# Patient Record
Sex: Female | Born: 1955
Health system: Southern US, Community
[De-identification: ages and names within clinical notes are randomized; demographics above are authoritative.]

## PROBLEM LIST (undated history)

## (undated) DIAGNOSIS — E785 Hyperlipidemia, unspecified: Secondary | ICD-10-CM

## (undated) DIAGNOSIS — M8580 Other specified disorders of bone density and structure, unspecified site: Secondary | ICD-10-CM

## (undated) DIAGNOSIS — T7840XA Allergy, unspecified, initial encounter: Secondary | ICD-10-CM

## (undated) DIAGNOSIS — F329 Major depressive disorder, single episode, unspecified: Secondary | ICD-10-CM

## (undated) DIAGNOSIS — I10 Essential (primary) hypertension: Secondary | ICD-10-CM

## (undated) DIAGNOSIS — H905 Unspecified sensorineural hearing loss: Secondary | ICD-10-CM

## (undated) DIAGNOSIS — F32A Depression, unspecified: Secondary | ICD-10-CM

## (undated) DIAGNOSIS — G43909 Migraine, unspecified, not intractable, without status migrainosus: Secondary | ICD-10-CM

## (undated) DIAGNOSIS — K219 Gastro-esophageal reflux disease without esophagitis: Secondary | ICD-10-CM

## (undated) DIAGNOSIS — M1990 Unspecified osteoarthritis, unspecified site: Secondary | ICD-10-CM

## (undated) HISTORY — DX: Unspecified osteoarthritis, unspecified site: M19.90

## (undated) HISTORY — DX: Essential (primary) hypertension: I10

## (undated) HISTORY — DX: Other specified disorders of bone density and structure, unspecified site: M85.80

## (undated) HISTORY — DX: Allergy, unspecified, initial encounter: T78.40XA

## (undated) HISTORY — DX: Unspecified sensorineural hearing loss: H90.5

## (undated) HISTORY — DX: Gastro-esophageal reflux disease without esophagitis: K21.9

## (undated) HISTORY — DX: Depression, unspecified: F32.A

## (undated) HISTORY — DX: Migraine, unspecified, not intractable, without status migrainosus: G43.909

## (undated) HISTORY — DX: Major depressive disorder, single episode, unspecified: F32.9

## (undated) HISTORY — DX: Hyperlipidemia, unspecified: E78.5

---

## 2001-01-08 ENCOUNTER — Emergency Department (HOSPITAL_COMMUNITY): Admission: EM | Admit: 2001-01-08 | Discharge: 2001-01-08 | Payer: Self-pay | Admitting: Emergency Medicine

## 2005-05-09 ENCOUNTER — Ambulatory Visit (HOSPITAL_COMMUNITY): Admission: RE | Admit: 2005-05-09 | Discharge: 2005-05-09 | Payer: Self-pay | Admitting: *Deleted

## 2006-02-06 HISTORY — PX: JOINT REPLACEMENT: SHX530

## 2010-04-27 ENCOUNTER — Other Ambulatory Visit: Payer: Self-pay | Admitting: Physical Medicine and Rehabilitation

## 2010-04-27 DIAGNOSIS — R52 Pain, unspecified: Secondary | ICD-10-CM

## 2010-05-06 ENCOUNTER — Ambulatory Visit
Admission: RE | Admit: 2010-05-06 | Discharge: 2010-05-06 | Disposition: A | Discharge status: Home / Self Care | Payer: BC Managed Care – HMO | Source: Ambulatory Visit | Attending: Physical Medicine and Rehabilitation | Admitting: Physical Medicine and Rehabilitation

## 2010-05-06 DIAGNOSIS — R52 Pain, unspecified: Secondary | ICD-10-CM

## 2010-05-16 ENCOUNTER — Other Ambulatory Visit: Payer: Self-pay | Admitting: Physical Medicine and Rehabilitation

## 2010-05-16 DIAGNOSIS — M542 Cervicalgia: Secondary | ICD-10-CM

## 2010-05-18 ENCOUNTER — Ambulatory Visit
Admission: RE | Admit: 2010-05-18 | Discharge: 2010-05-18 | Disposition: A | Discharge status: Home / Self Care | Payer: BC Managed Care – HMO | Source: Ambulatory Visit | Attending: Physical Medicine and Rehabilitation | Admitting: Physical Medicine and Rehabilitation

## 2010-05-18 DIAGNOSIS — M542 Cervicalgia: Secondary | ICD-10-CM

## 2011-03-30 ENCOUNTER — Ambulatory Visit (INDEPENDENT_AMBULATORY_CARE_PROVIDER_SITE_OTHER): Payer: BC Managed Care – PPO | Admitting: Emergency Medicine

## 2011-03-30 DIAGNOSIS — R209 Unspecified disturbances of skin sensation: Secondary | ICD-10-CM

## 2011-03-30 DIAGNOSIS — M47812 Spondylosis without myelopathy or radiculopathy, cervical region: Secondary | ICD-10-CM

## 2011-03-30 DIAGNOSIS — M47819 Spondylosis without myelopathy or radiculopathy, site unspecified: Secondary | ICD-10-CM

## 2011-03-30 DIAGNOSIS — M545 Low back pain: Secondary | ICD-10-CM

## 2011-03-30 MED ORDER — CYCLOBENZAPRINE HCL 10 MG PO TABS: ORAL_TABLET | ORAL | Status: DC

## 2011-03-30 MED ORDER — PREDNISONE 20 MG PO TABS: ORAL_TABLET | ORAL | Status: DC

## 2011-03-30 NOTE — Progress Notes (Signed)
  Subjective:    Patient ID: Kristen Scott, female    DOB: [birth year], 56 y.o.   MRN: [id number]  HPI patient enters with a 2 week history of pain in her lower back. She states she has a history of low back problems. She's been to Dr. Ethelene Hal in the past and had injections of her neck before. She denies any recent trauma to her back but does work at a daycare facility. She generally takes 1-2 hydrocodone per day for her back pain.    Review of Systems patient denies urinary symptoms she denies dysuria or bone pain.     Objective:   Physical Exam physical exam reveals tenderness over the lower lumbar spine. There is a scar over the left knee from previous joint replacement to straight leg raising is negative bilaterally. Deep tendon reflexes are symmetrical. There was no weakness of the lower extremities by exam.        Assessment & Plan:  Patient in with exacerbation of her low back pain. She has known cervical and low back issues. She is under the care of Dr. Ethelene Hal for these problems.

## 2011-03-30 NOTE — Patient Instructions (Signed)
Back Pain, Adult Low back pain is very common. About 1 in 5 people have back pain.The cause of low back pain is rarely dangerous. The pain often gets better over time.About half of people with a sudden onset of back pain feel better in just 2 weeks. About 8 in 10 people feel better by 6 weeks.  CAUSES Some common causes of back pain include:  Strain of the muscles or ligaments supporting the spine.   Wear and tear (degeneration) of the spinal discs.   Arthritis.   Direct injury to the back.  DIAGNOSIS Most of the time, the direct cause of low back pain is not known.However, back pain can be treated effectively even when the exact cause of the pain is unknown.Answering your caregiver's questions about your overall health and symptoms is one of the most accurate ways to make sure the cause of your pain is not dangerous. If your caregiver needs more information, he or she may order lab work or imaging tests (X-rays or MRIs).However, even if imaging tests show changes in your back, this usually does not require surgery. HOME CARE INSTRUCTIONS For many people, back pain returns.Since low back pain is rarely dangerous, it is often a condition that people can learn to manageon their own.   Remain active. It is stressful on the back to sit or stand in one place. Do not sit, drive, or stand in one place for more than 30 minutes at a time. Take short walks on level surfaces as soon as pain allows.Try to increase the length of time you walk each day.   Do not stay in bed.Resting more than 1 or 2 days can delay your recovery.   Do not avoid exercise or work.Your body is made to move.It is not dangerous to be active, even though your back may hurt.Your back will likely heal faster if you return to being active before your pain is gone.   Pay attention to your body when you bend and lift. Many people have less discomfortwhen lifting if they bend their knees, keep the load close to their  bodies,and avoid twisting. Often, the most comfortable positions are those that put less stress on your recovering back.   Find a comfortable position to sleep. Use a firm mattress and lie on your side with your knees slightly bent. If you lie on your back, put a pillow under your knees.   Only take over-the-counter or prescription medicines as directed by your caregiver. Over-the-counter medicines to reduce pain and inflammation are often the most helpful.Your caregiver may prescribe muscle relaxant drugs.These medicines help dull your pain so you can more quickly return to your normal activities and healthy exercise.   Put ice on the injured area.   Put ice in a plastic bag.   Place a towel between your skin and the bag.   Leave the ice on for 15 to 20 minutes, 3 to 4 times a day for the first 2 to 3 days. After that, ice and heat may be alternated to reduce pain and spasms.   Ask your caregiver about trying back exercises and gentle massage. This may be of some benefit.   Avoid feeling anxious or stressed.Stress increases muscle tension and can worsen back pain.It is important to recognize when you are anxious or stressed and learn ways to manage it.Exercise is a great option.  SEEK MEDICAL CARE IF:  You have pain that is not relieved with rest or medicine.   You have   pain that does not improve in 1 week.   You have new symptoms.   You are generally not feeling well.  SEEK IMMEDIATE MEDICAL CARE IF:   You have pain that radiates from your back into your legs.   You develop new bowel or bladder control problems.   You have unusual weakness or numbness in your arms or legs.   You develop nausea or vomiting.   You develop abdominal pain.   You feel faint.  Document Released: 01/23/2005 Document Revised: 10/05/2010 Document Reviewed: 06/13/2010 [employer] Patient Information 2012 [employer]. 

## 2011-08-17 ENCOUNTER — Encounter: Payer: Self-pay | Admitting: Family Medicine

## 2011-08-17 ENCOUNTER — Ambulatory Visit (INDEPENDENT_AMBULATORY_CARE_PROVIDER_SITE_OTHER): Payer: BC Managed Care – PPO | Admitting: Family Medicine

## 2011-08-17 VITALS — BP 134/91 | HR 74 | Temp 97.4°F | Resp 16 | Ht 62.0 in | Wt 214.0 lb

## 2011-08-17 DIAGNOSIS — E559 Vitamin D deficiency, unspecified: Secondary | ICD-10-CM

## 2011-08-17 DIAGNOSIS — Z0000 Encounter for general adult medical examination without abnormal findings: Secondary | ICD-10-CM

## 2011-08-17 DIAGNOSIS — E669 Obesity, unspecified: Secondary | ICD-10-CM

## 2011-08-17 DIAGNOSIS — E785 Hyperlipidemia, unspecified: Secondary | ICD-10-CM

## 2011-08-17 DIAGNOSIS — I10 Essential (primary) hypertension: Secondary | ICD-10-CM

## 2011-08-17 DIAGNOSIS — F329 Major depressive disorder, single episode, unspecified: Secondary | ICD-10-CM

## 2011-08-17 LAB — LIPID PANEL
HDL: 38 mg/dL — ABNORMAL LOW (ref >39)
LDL Cholesterol: 142 mg/dL — ABNORMAL HIGH (ref 0–99)
Total CHOL/HDL Ratio: 6.5 Ratio
VLDL: 68 mg/dL — ABNORMAL HIGH (ref 0–40)

## 2011-08-17 LAB — POCT URINALYSIS DIPSTICK
Blood, UA: NEGATIVE
Ketones, UA: NEGATIVE
Leukocytes, UA: NEGATIVE
Nitrite, UA: NEGATIVE
Spec Grav, UA: 1.02
Urobilinogen, UA: 0.2

## 2011-08-17 LAB — COMPREHENSIVE METABOLIC PANEL WITH GFR
ALT: 23 U/L (ref 0–35)
AST: 23 U/L (ref 0–37)
Albumin: 3.9 g/dL (ref 3.5–5.2)
Alkaline Phosphatase: 93 U/L (ref 39–117)
BUN: 20 mg/dL (ref 6–23)
CO2: 24 meq/L (ref 19–32)
Calcium: 9 mg/dL (ref 8.4–10.5)
Chloride: 106 meq/L (ref 96–112)
Creat: 0.69 mg/dL (ref 0.50–1.10)
Glucose, Bld: 100 mg/dL — ABNORMAL HIGH (ref 70–99)
Potassium: 4.1 meq/L (ref 3.5–5.3)
Sodium: 140 meq/L (ref 135–145)
Total Bilirubin: 0.4 mg/dL (ref 0.3–1.2)
Total Protein: 6.9 g/dL (ref 6.0–8.3)

## 2011-08-17 MED ORDER — ATORVASTATIN CALCIUM 40 MG PO TABS: 40.0000 mg | ORAL_TABLET | Freq: Every day | ORAL | Status: DC

## 2011-08-17 MED ORDER — METOPROLOL SUCCINATE ER 100 MG PO TB24: 100.0000 mg | ORAL_TABLET | Freq: Every day | ORAL | Status: DC

## 2011-08-17 MED ORDER — DULOXETINE HCL 60 MG PO CPEP: 60.0000 mg | ORAL_CAPSULE | Freq: Every day | ORAL | Status: DC

## 2011-08-17 MED ORDER — DULOXETINE HCL 30 MG PO CPEP: 30.0000 mg | ORAL_CAPSULE | Freq: Every day | ORAL | Status: DC

## 2011-08-17 NOTE — Patient Instructions (Addendum)
Calorie Counting Diet A calorie counting diet requires you to eat the number of calories that are right for you in a day. Calories are the measurement of how much energy you get from the food you eat. Eating the right amount of calories is important for staying at a healthy weight. If you eat too many calories, your body will store them as fat and you may gain weight. If you eat too few calories, you may lose weight. Counting the number of calories you eat during a day will help you know if you are eating the right amount. A Registered Dietitian can determine how many calories you need in a day. The amount of calories needed varies from person to person. If your goal is to lose weight, you will need to eat fewer calories. Losing weight can benefit you if you are overweight or have health problems such as heart disease, high blood pressure, or diabetes. If your goal is to gain weight, you will need to eat more calories. Gaining weight may be necessary if you have a certain health problem that causes your body to need more energy. TIPS Whether you are increasing or decreasing the number of calories you eat during a day, it may be hard to get used to changes in what you eat and drink. The following are tips to help you keep track of the number of calories you eat.  Measure foods at home with measuring cups. This helps you know the amount of food and number of calories you are eating.   Restaurants often serve food in amounts that are larger than 1 serving. While eating out, estimate how many servings of a food you are given. For example, a serving of cooked rice is  cup or about the size of half of a fist. Knowing serving sizes will help you be aware of how much food you are eating at restaurants.   Ask for smaller portion sizes or child-size portions at restaurants.   Plan to eat half of a meal at a restaurant. Take the rest home or share the other half with a friend.   Read the Nutrition Facts panel on  food labels for calorie content and serving size. You can find out how many servings are in a package, the size of a serving, and the number of calories each serving has.   For example, a package might contain 3 cookies. The Nutrition Facts panel on that package says that 1 serving is 1 cookie. Below that, it will say there are 3 servings in the container. The calories section of the Nutrition Facts label says there are 90 calories. This means there are 90 calories in 1 cookie (1 serving). If you eat 1 cookie you have eaten 90 calories. If you eat all 3 cookies, you have eaten 270 calories (3 servings x 90 calories = 270 calories).  The list below tells you how big or small some common portion sizes are.  1 oz.........4 stacked dice.   3 oz.........Deck of cards.   1 tsp........Tip of little finger.   1 tbs........Thumb.   2 tbs........Golf ball.    cup.......Half of a fist.   1 cup........A fist.  KEEP A FOOD LOG Write down every food item you eat, the amount you eat, and the number of calories in each food you eat during the day. At the end of the day, you can add up the total number of calories you have eaten. It may help to keep a   list like the one below. Find out the calorie information by reading the Nutrition Facts panel on food labels. Breakfast  Bran cereal (1 cup, 110 calories).   Fat-free milk ( cup, 45 calories).  Snack  Apple (1 medium, 80 calories).  Lunch  Spinach (1 cup, 20 calories).   Tomato ( medium, 20 calories).   Chicken breast strips (3 oz, 165 calories).   Shredded cheddar cheese ( cup, 110 calories).   Light [place] dressing (2 tbs, 60 calories).   Whole-wheat bread (1 slice, 80 calories).   Tub margarine (1 tsp, 35 calories).   Vegetable soup (1 cup, 160 calories).  Dinner  Pork chop (3 oz, 190 calories).   Brown rice (1 cup, 215 calories).   Steamed broccoli ( cup, 20 calories).   Strawberries (1  cup, 65 calories).   Whipped  cream (1 tbs, 50 calories).  Daily Calorie Total: 1425 Document Released: 01/23/2005 Document Revised: 01/12/2011 Document Reviewed: 07/20/2006 [facility] Patient Information 2012 [place], [place]   .Keeping You Healthy  Get These Tests  Blood Pressure- Have your blood pressure checked by your healthcare provider at least once a year.  Normal blood pressure is 120/80.  Weight- Have your body mass index (BMI) calculated to screen for obesity.  BMI is a measure of body fat based on height and weight.  You can calculate your own BMI at https://www.west-esparza.com/  Cholesterol- Have your cholesterol checked every year.  Diabetes- Have your blood sugar checked every year if you have high blood pressure, high cholesterol, a family history of diabetes or if you are overweight.  Pap Smear- Have a pap smear every 1 to 3 years if you have been sexually active.  If you are older than 65 and recent pap smears have been normal you may not need additional pap smears.  In addition, if you have had a hysterectomy  For benign disease additional pap smears are not necessary.  Mammogram-Yearly mammograms are essential for early detection of breast cancer  Screening for Colon Cancer- Colonoscopy starting at age 33. Screening may begin sooner depending on your family history and other health conditions.  Follow up colonoscopy as directed by your Gastroenterologist.  Screening for Osteoporosis- Screening begins at age 73 with bone density scanning, sooner if you are at higher risk for developing Osteoporosis.  Get these medicines Calcium with Vitamin D- Your body requires 1200-1500 mg of Calcium a day and [phone] IU of Vitamin D a day.  You can only absorb 500 mg of Calcium at a time therefore Calcium must be taken in 2 or 3 separate doses throughout the day  Get an OTC Vitamin D3 supplement  2000 IU and take it once a day.  Hormones- Hormone therapy has been associated with increased risk for certain cancers  and heart disease.  Talk to your healthcare provider about if you need relief from menopausal symptoms.  Aspirin- Ask your healthcare provider about taking Aspirin to prevent Heart Disease and Stroke.  Get these Immuniztions  Flu shot- Every fall  Pneumonia shot- Once after the age of 56; if you are younger ask your healthcare provider if you need a pneumonia shot.  Tetanus- Every ten years.  Zostavax- Once after the age of 24 to prevent shingles.- Done  Take these steps  Don't smoke- Your healthcare provider can help you quit. For tips on how to quit, ask your healthcare provider or go to www.smokefree.gov or call 1-800 QUIT-NOW.  Be physically active- Exercise 5 days a week  for a minimum of 30 minutes.  If you are not already physically active, start slow and gradually work up to 30 minutes of moderate physical activity.  Try walking, dancing, bike riding, swimming, etc.  Eat a healthy diet- Eat a variety of healthy foods such as fruits, vegetables, whole grains, low fat milk, low fat cheeses, yogurt, lean meats, chicken, fish, eggs, dried beans, tofu, etc.  For more information go to www.thenutritionsource.org  Dental visit- Brush and floss teeth twice daily; visit your dentist twice a year.  Eye exam- Visit your Optometrist or Ophthalmologist yearly.  Drink alcohol in moderation- Limit alcohol intake to one drink or less a day.  Never drink and drive.  Depression- Your emotional health is as important as your physical health.  If you're feeling down or losing interest in things you normally enjoy, please talk to your healthcare provider.  Seat Belts- can save your life; always wear one  Smoke/Carbon Monoxide detectors- These detectors need to be installed on the appropriate level of your home.  Replace batteries at least once a year.  Violence- If anyone is threatening or hurting you, please tell your healthcare provider.  Living Will/ Health care power of attorney- Discuss  with your healthcare provider and family.   Exercise to Lose Weight Exercise and a healthy diet may help you lose weight. Your doctor may suggest specific exercises. EXERCISE IDEAS AND TIPS  Choose low-cost things you enjoy doing, such as walking, bicycling, or exercising to workout videos.   Take stairs instead of the elevator.   Walk during your lunch break.   Park your car further away from work or school.   Go to a gym or an exercise class.   Start with 5 to 10 minutes of exercise each day. Build up to 30 minutes of exercise 4 to 6 days a week.   Wear shoes with good support and comfortable clothes.   Stretch before and after working out.   Work out until you breathe harder and your heart beats faster.   Drink extra water when you exercise.   Do not do so much that you hurt yourself, feel dizzy, or get very short of breath.  Exercises that burn about 150 calories:  Running 1  miles in 15 minutes.   Playing volleyball for 45 to 60 minutes.   Washing and waxing a car for 45 to 60 minutes.   Playing touch football for 45 minutes.   Walking 1  miles in 35 minutes.   Pushing a stroller 1  miles in 30 minutes.   Playing basketball for 30 minutes.   Raking leaves for 30 minutes.   Bicycling 5 miles in 30 minutes.   Walking 2 miles in 30 minutes.   Dancing for 30 minutes.   Shoveling snow for 15 minutes.   Swimming laps for 20 minutes.   Walking up stairs for 15 minutes.   Bicycling 4 miles in 15 minutes.   Gardening for 30 to 45 minutes.   Jumping rope for 15 minutes.   Washing windows or floors for 45 to 60 minutes.  Document Released: 02/25/2010 Document Revised: 01/12/2011 Document Reviewed: 02/25/2010 [facility] Patient Information 2012 [place], [place].

## 2011-08-17 NOTE — Progress Notes (Signed)
Subjective:    Patient ID: Kristen Scott, female    DOB: Nov 21, 1955, 56 y.o.   MRN: [id number]  HPI   This 56 y.o. Cauc female is here for CPE, medication review and lab tests. She has HTN, GERD,  Hyperlipidemia and chronic pain related to Cervical DDD treated by Dr. Ethelene Hal. She reports com-  pliance with medications and no adverse effects.    She is single/ separated and works as a Engineer, water; exercises 3x/week- walking.  Denies tobacco use or alcohol consumption.   Last PAP: 2 years ago- normal/neg.  Last MMG: Aug 2012- normal  Last  Colonoscopy: Oct 2012- normal    Review of Systems  Constitutional: Negative.   HENT: Positive for neck stiffness.   Eyes: Negative.   Respiratory: Negative.   Cardiovascular: Negative.   Gastrointestinal: Negative.   Genitourinary: Negative.   Musculoskeletal: Positive for back pain, arthralgias and gait problem.       Improved after last joint injection  Skin: Negative.   Neurological: Negative.   Hematological: Negative.   Psychiatric/Behavioral: Negative.        Objective:   Physical Exam  Nursing note and vitals reviewed. Constitutional: She is oriented to person, place, and time. She appears well-developed and well-nourished. No distress.  HENT:  Head: Normocephalic and atraumatic.  Right Ear: Hearing, tympanic membrane, external ear and ear canal normal.  Left Ear: Hearing, tympanic membrane, external ear and ear canal normal.  Nose: Nose normal. No mucosal edema, rhinorrhea or nasal deformity.  Mouth/Throat: Uvula is midline, oropharynx is clear and moist and mucous membranes are normal. Normal dentition. No posterior oropharyngeal erythema.  Eyes: EOM and lids are normal. Pupils are equal, round, and reactive to light. Right eye exhibits no discharge. Left eye exhibits no discharge. Right conjunctiva is not injected. Left conjunctiva is not injected.  Fundoscopic exam:      The right eye shows AV nicking. The right eye  shows no papilledema. The right eye shows red reflex.      The left eye shows AV nicking. The left eye shows no papilledema. The left eye shows red reflex. Neck: No JVD present. Muscular tenderness present. Carotid bruit is not present. No rigidity. Decreased range of motion present. No mass and no thyromegaly present.  Cardiovascular: Normal rate, regular rhythm, normal heart sounds and intact distal pulses.  Exam reveals no gallop and no friction rub.   No murmur heard. Pulmonary/Chest: Effort normal and breath sounds normal. No respiratory distress. She has no wheezes.  Abdominal: Soft. She exhibits no distension, no abdominal bruit and no mass. Bowel sounds are decreased. There is no hepatosplenomegaly. There is no tenderness. There is no guarding and no CVA tenderness. Hernia confirmed negative in the ventral area.  Genitourinary:       GYN/PAP deferred per pt preference  Musculoskeletal:       Right knee: She exhibits decreased range of motion, deformity, abnormal alignment and bony tenderness. She exhibits no swelling, no effusion and normal patellar mobility. tenderness found. Medial joint line and lateral joint line tenderness noted. No patellar tendon tenderness noted.       Left knee: She exhibits decreased range of motion, deformity, abnormal alignment and bony tenderness. She exhibits no swelling, no effusion and normal patellar mobility. tenderness found. Medial joint line and lateral joint line tenderness noted. No patellar tendon tenderness noted.  Lymphadenopathy:    She has no cervical adenopathy.  Neurological: She is alert and oriented to person, place,  and time. She has normal reflexes. No cranial nerve deficit. She exhibits normal muscle tone. Coordination normal.  Skin: Skin is warm and dry. No rash noted. No erythema. No pallor.  Psychiatric: She has a normal mood and affect. Her behavior is normal. Judgment and thought content normal.    LABS (12/26/2010):  Gluc= 106  Cr=  0.80  LFTs= normal   TChol= 210  TGs= 187  HDL= 48  LDL= 125            (05/30/2010):  TChol= 322   TGs= 321   HDL= 47   LDL= 211      A1C= 5.6%            (11//03/2008): Vit D= 15     Assessment & Plan:   1. Routine general medical examination at a health care facility  Guidance for Healthier lifestyle  2. Vitamin d deficiency  Vitamin D, 25-hydroxy- pt not taking supplement  3. Hyperlipidemia - on Atorvastatin 40 mg Lipid panel  4. HTN (hypertension) - stable but not at goal Comprehensive metabolic panel Cotinue Metoprolol succ 100 mg 1 tab daily   5. Obesity, Class II, BMI 35-39.9  Encourage weight reduction;short term goal= 200 lbs   6.   Depression - Refill Cymbalta (generic) total daily dose of 90 mg

## 2011-08-18 LAB — VITAMIN D 25 HYDROXY (VIT D DEFICIENCY, FRACTURES): Vit D, 25-Hydroxy: 29 ng/mL — ABNORMAL LOW (ref 30–89)

## 2011-08-19 NOTE — Progress Notes (Signed)
Quick Note:  Please call pt and advise that the following labs are abnormal... Cholesterol numbers and triglycerides are very elevated (not sure if pt is taking lipid medication daily). Emphasize that this is very important and improving nutrition to reduce fat and unhealthy "fats" as much as possible. Regular exercise necessary ! (These results may have been non-fasting).  Will recheck Lipids at next visit in 3 -4 months If not improved, will need to increase Atorvastatin. Vitamin D a little low; get OTC Vitamin D3 2000 IU and take 1 capsule every day. Try to get some sun exposure most days of the week.  Copy to pt. ______

## 2011-08-20 ENCOUNTER — Encounter: Payer: Self-pay | Admitting: Family Medicine

## 2011-08-20 DIAGNOSIS — E669 Obesity, unspecified: Secondary | ICD-10-CM | POA: Insufficient documentation

## 2011-08-20 DIAGNOSIS — E785 Hyperlipidemia, unspecified: Secondary | ICD-10-CM | POA: Insufficient documentation

## 2011-08-20 DIAGNOSIS — I10 Essential (primary) hypertension: Secondary | ICD-10-CM | POA: Insufficient documentation

## 2011-08-20 DIAGNOSIS — F329 Major depressive disorder, single episode, unspecified: Secondary | ICD-10-CM | POA: Insufficient documentation

## 2011-08-20 DIAGNOSIS — M1710 Unilateral primary osteoarthritis, unspecified knee: Secondary | ICD-10-CM | POA: Insufficient documentation

## 2011-08-20 DIAGNOSIS — E559 Vitamin D deficiency, unspecified: Secondary | ICD-10-CM | POA: Insufficient documentation

## 2011-08-20 DIAGNOSIS — E66812 Obesity, class 2: Secondary | ICD-10-CM | POA: Insufficient documentation

## 2011-08-20 DIAGNOSIS — G894 Chronic pain syndrome: Secondary | ICD-10-CM | POA: Insufficient documentation

## 2011-08-22 ENCOUNTER — Encounter: Payer: Self-pay | Admitting: Family Medicine

## 2011-11-26 ENCOUNTER — Other Ambulatory Visit: Payer: Self-pay | Admitting: *Deleted

## 2011-11-26 MED ORDER — ATORVASTATIN CALCIUM 40 MG PO TABS: 40.0000 mg | ORAL_TABLET | Freq: Every day | ORAL | Status: DC

## 2012-03-08 ENCOUNTER — Other Ambulatory Visit: Payer: Self-pay | Admitting: Family Medicine

## 2012-03-13 ENCOUNTER — Ambulatory Visit (INDEPENDENT_AMBULATORY_CARE_PROVIDER_SITE_OTHER): Payer: BC Managed Care – PPO | Admitting: Family Medicine

## 2012-03-13 ENCOUNTER — Encounter: Payer: Self-pay | Admitting: Family Medicine

## 2012-03-13 VITALS — BP 120/74 | HR 65 | Temp 98.0°F | Resp 16 | Ht 60.75 in | Wt 215.4 lb

## 2012-03-13 DIAGNOSIS — E785 Hyperlipidemia, unspecified: Secondary | ICD-10-CM

## 2012-03-13 DIAGNOSIS — I10 Essential (primary) hypertension: Secondary | ICD-10-CM

## 2012-03-13 DIAGNOSIS — E7849 Other hyperlipidemia: Secondary | ICD-10-CM

## 2012-03-13 MED ORDER — METOPROLOL SUCCINATE ER 100 MG PO TB24: 100.0000 mg | ORAL_TABLET | Freq: Every day | ORAL | Status: DC

## 2012-03-13 MED ORDER — ATORVASTATIN CALCIUM 40 MG PO TABS: 40.0000 mg | ORAL_TABLET | Freq: Every day | ORAL | Status: DC

## 2012-03-13 NOTE — Patient Instructions (Signed)
For information about the [facility] "LIPID DIET"- go to the [facility]) web site and see if there is a link to the "Lipid Disorders Clinic".

## 2012-03-13 NOTE — Progress Notes (Signed)
S: This 57 y.o Cauc female has HTN and Familial Hyperlipidemia; her brother has been able to maintain a normal lipid values w/ Atkins Diet. Pt has tried this and does not do well on it because of limitations on fruits and other restrictions. She reports no side effects w/ Atorvastatin which she has been taking for ~ 2 years. She has chronic DDD of spine which accounts for back pain.  ROS: Noncontributory. She is trying to lose weight. Negative for CP or tightness, palpitations, SOB or DOE, cough, swelling, GI/GU problems, myalgias, HA, dizziness, weakness or syncope.  O:  Filed Vitals:   03/13/12 1050  BP: 120/74                               Weight up 1 lb from July 2013  Pulse: 65  Temp: 98 F (36.7 C)  Resp: 16   GEN: In NAD; WN,WD. HENT: [place]/AT. EOMI w/ clear conj/ sclerae. Oroph clear and moist. COR: RRR. LUNGS: Normal resp rate and effort. NEURO: A&O x 3; CNs intact. Nonfocal.  A/P:  1. Hyperlipidemia, familial, high LDL  Lipid panel  2. HTN (hypertension)  Comprehensive metabolic panel    Meds ordered this encounter  Medications  . metoprolol succinate (TOPROL-XL) 100 MG 24 hr tablet    Sig: Take 1 tablet (100 mg total) by mouth daily. Take with or immediately following a meal.    Dispense:  30 tablet    Refill:  11  . atorvastatin (LIPITOR) 40 MG tablet    Sig: Take 1 tablet (40 mg total) by mouth daily.    Dispense:  30 tablet    Refill:  5

## 2012-03-14 LAB — COMPREHENSIVE METABOLIC PANEL
ALT: 23 U/L (ref 0–35)
Alkaline Phosphatase: 92 U/L (ref 39–117)
CO2: 29 mEq/L (ref 19–32)
Chloride: 106 mEq/L (ref 96–112)
Creat: 0.74 mg/dL (ref 0.50–1.10)
Sodium: 142 mEq/L (ref 135–145)
Total Bilirubin: 0.5 mg/dL (ref 0.3–1.2)
Total Protein: 6.7 g/dL (ref 6.0–8.3)

## 2012-03-14 LAB — LIPID PANEL
Cholesterol: 194 mg/dL (ref 0–200)
LDL Cholesterol: 104 mg/dL — ABNORMAL HIGH (ref 0–99)
Total CHOL/HDL Ratio: 5.5 Ratio
VLDL: 55 mg/dL — ABNORMAL HIGH (ref 0–40)

## 2012-03-19 NOTE — Progress Notes (Signed)
Quick Note:  Please call pt and advise that the following labs are abnormal... Chemistries are normal (blood sugar, sodium, potassium, kidney and liver tests).  Lipids are better but triglycerides are still above normal. Continue taking lipid medication (Atorvastatin) and work on improving nutrition. Try to increase physical activity. Follow up in 4-6 months for labs.  Copy to pt. ______

## 2012-04-13 ENCOUNTER — Emergency Department (HOSPITAL_COMMUNITY)
Admission: EM | Admit: 2012-04-13 | Discharge: 2012-04-13 | Disposition: A | Discharge status: Home / Self Care | Payer: BC Managed Care – HMO | Attending: Emergency Medicine | Admitting: Emergency Medicine

## 2012-04-13 ENCOUNTER — Encounter (HOSPITAL_COMMUNITY): Payer: Self-pay | Admitting: *Deleted

## 2012-04-13 DIAGNOSIS — K219 Gastro-esophageal reflux disease without esophagitis: Secondary | ICD-10-CM | POA: Insufficient documentation

## 2012-04-13 DIAGNOSIS — G43909 Migraine, unspecified, not intractable, without status migrainosus: Secondary | ICD-10-CM | POA: Insufficient documentation

## 2012-04-13 DIAGNOSIS — R509 Fever, unspecified: Secondary | ICD-10-CM | POA: Insufficient documentation

## 2012-04-13 DIAGNOSIS — H903 Sensorineural hearing loss, bilateral: Secondary | ICD-10-CM | POA: Insufficient documentation

## 2012-04-13 DIAGNOSIS — I10 Essential (primary) hypertension: Secondary | ICD-10-CM | POA: Insufficient documentation

## 2012-04-13 DIAGNOSIS — F3289 Other specified depressive episodes: Secondary | ICD-10-CM | POA: Insufficient documentation

## 2012-04-13 DIAGNOSIS — J020 Streptococcal pharyngitis: Secondary | ICD-10-CM | POA: Insufficient documentation

## 2012-04-13 DIAGNOSIS — R5381 Other malaise: Secondary | ICD-10-CM | POA: Insufficient documentation

## 2012-04-13 DIAGNOSIS — Z8739 Personal history of other diseases of the musculoskeletal system and connective tissue: Secondary | ICD-10-CM | POA: Insufficient documentation

## 2012-04-13 DIAGNOSIS — E785 Hyperlipidemia, unspecified: Secondary | ICD-10-CM | POA: Insufficient documentation

## 2012-04-13 DIAGNOSIS — Z79899 Other long term (current) drug therapy: Secondary | ICD-10-CM | POA: Insufficient documentation

## 2012-04-13 MED ORDER — PENICILLIN G BENZATHINE 1200000 UNIT/2ML IM SUSP
1.2000 10*6.[IU] | Freq: Once | INTRAMUSCULAR | Status: AC
  Administered 2012-04-13: 1.2 10*6.[IU] via INTRAMUSCULAR
  Filled 2012-04-13: qty 2

## 2012-04-13 MED ORDER — ACETAMINOPHEN 325 MG PO TABS
650.0000 mg | ORAL_TABLET | Freq: Once | ORAL | Status: AC
  Administered 2012-04-13: 650 mg via ORAL
  Filled 2012-04-13 (×2): qty 1

## 2012-04-13 MED ORDER — LIDOCAINE VISCOUS 2 % MT SOLN: 15.0000 mL | OROMUCOSAL | Status: DC | PRN

## 2012-04-13 MED ORDER — FLUTICASONE PROPIONATE 50 MCG/ACT NA SUSP: 2.0000 | Freq: Every day | NASAL | Status: DC

## 2012-04-13 MED ORDER — LIDOCAINE VISCOUS 2 % MT SOLN
20.0000 mL | Freq: Once | OROMUCOSAL | Status: AC
  Administered 2012-04-13: 20 mL via OROMUCOSAL
  Filled 2012-04-13: qty 20

## 2012-04-13 NOTE — ED Notes (Signed)
MD at bedside. 

## 2012-04-13 NOTE — ED Provider Notes (Signed)
History     CSN: [id number]  Arrival date & time 04/13/12  4098   First MD Initiated Contact with Patient 04/13/12 0654      Chief Complaint  Patient presents with  . Sore Throat    (Consider location/radiation/quality/duration/timing/severity/associated sxs/prior treatment) HPI  Kristen Scott is a 57 y.o. female complaining of fever and fatigue starting 2 days ago, sore throat developed today. Patient denies cough, difficulty controlling secretions, sinus pressure, shortness of breath, runny nose, chest pain, shortness of breath, abdominal pain, change in bowel or bladder habits.  Past Medical History  Diagnosis Date  . Arthritis   . GERD (gastroesophageal reflux disease)   . DJD (degenerative joint disease)     left knee  . Hyperlipidemia   . Migraine   . Depression   . Osteopenia   . HTN (hypertension)   . Hearing loss, sensorineural     Past Surgical History  Procedure Laterality Date  . Joint replacement  2008    left knee    Family History  Problem Relation Age of Onset  . Dementia Mother   . Diabetes Father   . Diabetes Brother   . Diabetes Sister   . OCD Daughter   . Anxiety disorder Daughter   . Heart attack Maternal Grandfather     History  Substance Use Topics  . Smoking status: Never Smoker   . Smokeless tobacco: Not on file  . Alcohol Use: No    OB History   Grav Para Term Preterm Abortions TAB SAB Ect Mult Living                  Review of Systems  Constitutional: Positive for fever.  HENT: Positive for sore throat.   Respiratory: Negative for shortness of breath.   Cardiovascular: Negative for chest pain.  Gastrointestinal: Negative for nausea, vomiting, abdominal pain and diarrhea.  All other systems reviewed and are negative.    Allergies  Relafen  Home Medications   Current Outpatient Rx  Name  Route  Sig  Dispense  Refill  . atorvastatin (LIPITOR) 40 MG tablet   Oral   Take 1 tablet (40 mg total) by mouth daily.   30  tablet   5   . cyclobenzaprine (FLEXERIL) 10 MG tablet      Take one tablet each bedtime.   30 tablet   0   . DULoxetine (CYMBALTA) 30 MG capsule   Oral   Take 1 capsule (30 mg total) by mouth daily.   30 capsule   5   . DULoxetine (CYMBALTA) 60 MG capsule   Oral   Take 1 capsule (60 mg total) by mouth daily.   30 capsule   5   . HYDROcodone-acetaminophen (NORCO) 10-325 MG per tablet   Oral   Take 1 tablet by mouth 2 (two) times daily.         . metoprolol succinate (TOPROL-XL) 100 MG 24 hr tablet   Oral   Take 1 tablet (100 mg total) by mouth daily. Take with or immediately following a meal.   30 tablet   11   . omeprazole (PRILOSEC) 20 MG capsule   Oral   Take 20 mg by mouth daily.         . predniSONE (DELTASONE) 20 MG tablet      Please take 3 tablets a day for 3 days 2 tablets a day for 3 days one tablet a day for 3 days.   18 tablet  0     BP 100/76  Pulse 94  Temp(Src) 97.6 F (36.4 C) (Oral)  Resp 16  SpO2 95%  LMP 03/29/2006  Physical Exam  Nursing note and vitals reviewed. Constitutional: She is oriented to person, place, and time. She appears well-developed and well-nourished. No distress.  HENT:  Head: Normocephalic and atraumatic.  Mouth/Throat: Oropharyngeal exudate present.  Posterior pharynx is extremely erythematous, minimal tonsillar hypertrophy 1+ bilaterally, exudates presant  Eyes: Conjunctivae and EOM are normal. Pupils are equal, round, and reactive to light.  Neck: Normal range of motion.  Tender anterior cervical lymphadenopathy  Cardiovascular: Normal rate.   Pulmonary/Chest: Effort normal and breath sounds normal. No stridor. No respiratory distress. She has no wheezes. She has no rales. She exhibits no tenderness.  Abdominal: Soft.  Musculoskeletal: Normal range of motion.  Lymphadenopathy:    She has cervical adenopathy.  Neurological: She is alert and oriented to person, place, and time.  Psychiatric: She has a  normal mood and affect.    ED Course  Procedures (including critical care time)  Labs Reviewed  RAPID STREP SCREEN   No results found.   1. Strep pharyngitis       MDM   Kristen Scott is a 57 y.o. female  with fever and sore throat. Rapid strep is positive. I have discussed the option of not treating with antibiotics patient requests antibiotics and specifically a Bicillin shot versus amoxicillin by mouth. She requests refill for fluticasone.    Filed Vitals:   04/13/12 0645  BP: 100/76  Pulse: 94  Temp: 97.6 F (36.4 C)  TempSrc: Oral  Resp: 16  SpO2: 95%     Pt verbalized understanding and agrees with care plan. Outpatient follow-up and return precautions given.    New Prescriptions   FLUTICASONE (FLONASE) 50 MCG/ACT NASAL SPRAY    Place 2 sprays into the nose daily.   LIDOCAINE (XYLOCAINE) 2 % SOLUTION    Take 15 mLs by mouth every 3 (three) hours as needed for pain.          Wynetta Emery, PA-C 04/13/12 [phone]

## 2012-04-13 NOTE — ED Notes (Signed)
Pt c/o sore throat and fever since Thursday night. Pt denies difficulty swallowing, n/v.

## 2012-04-13 NOTE — ED Notes (Signed)
Patient complaining of weak and weakness like "I ws going to pass out". Patient thinks she has strep because she has pressure in ears and tender neck glands.

## 2012-04-14 NOTE — ED Provider Notes (Signed)
Medical screening examination/treatment/procedure(s) were performed by non-physician practitioner and as supervising physician I was immediately available for consultation/collaboration.  Douglas Delo, MD 04/14/12 1548 

## 2012-04-30 ENCOUNTER — Encounter: Payer: Self-pay | Admitting: Adult Health

## 2012-04-30 NOTE — Progress Notes (Signed)
This encounter was created in error - please disregard.

## 2012-11-30 ENCOUNTER — Ambulatory Visit (INDEPENDENT_AMBULATORY_CARE_PROVIDER_SITE_OTHER): Payer: BC Managed Care – PPO | Admitting: Family Medicine

## 2012-11-30 VITALS — BP 130/80 | HR 69 | Temp 98.9°F | Resp 12 | Ht 61.85 in | Wt 208.0 lb

## 2012-11-30 DIAGNOSIS — J340 Abscess, furuncle and carbuncle of nose: Secondary | ICD-10-CM

## 2012-11-30 DIAGNOSIS — L250 Unspecified contact dermatitis due to cosmetics: Secondary | ICD-10-CM

## 2012-11-30 DIAGNOSIS — J3489 Other specified disorders of nose and nasal sinuses: Secondary | ICD-10-CM

## 2012-11-30 DIAGNOSIS — L0201 Cutaneous abscess of face: Secondary | ICD-10-CM

## 2012-11-30 MED ORDER — SULFAMETHOXAZOLE-TRIMETHOPRIM 800-160 MG PO TABS: 2.0000 | ORAL_TABLET | Freq: Two times a day (BID) | ORAL | Status: DC

## 2012-11-30 MED ORDER — CEPHALEXIN 500 MG PO CAPS: 500.0000 mg | ORAL_CAPSULE | Freq: Three times a day (TID) | ORAL | Status: DC

## 2012-11-30 NOTE — Progress Notes (Signed)
Subjective:  This chart was scribed for Kristen Chick, MD by Karle Plumber, ED Scribe. This patient was seen in room 12 and the patient's care was started at 8:19 AM.   Patient ID: Kristen Scott, female    DOB: Sep 13, 1955, 57 y.o.   MRN: [id number]  HPI HPI Comments:  Kristen Scott is a 57 y.o. female who presents to [facility] complaining of a bump on her nose onset several months. She reports associated worsening redness, burning, itching, peeling and warmth for the past four days . She reports trying to manipulate it herself with no drainage. Pt states the area is tender to palpation. She reports having different cosmetic products used on her face approximately one week ago on Friday and Saturday for her son's wedding. She denies numbness, tingling, or fever.  Itching is diffuse along entire facial region.  No redness other than distal nose.  No fever/chills/sweats.  Denies numbness, tingling.  History of shingles age 8; also received shingles vaccine last year.  Review of Systems  Constitutional: Negative for fever.  Skin: Positive for color change (Left outter nose erythema).       Denies numbness or tingling.    Past Medical History  Diagnosis Date  . Arthritis   . GERD (gastroesophageal reflux disease)   . DJD (degenerative joint disease)     left knee  . Hyperlipidemia   . Migraine   . Depression   . Osteopenia   . HTN (hypertension)   . Hearing loss, sensorineural    Allergies  Allergen Reactions  . Relafen [Nabumetone] Rash   Current Outpatient Prescriptions on File Prior to Visit  Medication Sig Dispense Refill  . atorvastatin (LIPITOR) 40 MG tablet Take 1 tablet (40 mg total) by mouth daily.  30 tablet  5  . DULoxetine (CYMBALTA) 30 MG capsule Take 90 mg by mouth daily.      Marland Kitchen HYDROcodone-acetaminophen (NORCO) 10-325 MG per tablet Take 1 tablet by mouth 2 (two) times daily.      . metoprolol succinate (TOPROL-XL) 100 MG 24 hr tablet Take 1 tablet (100 mg total) by mouth  daily. Take with or immediately following a meal.  30 tablet  11  . omeprazole (PRILOSEC) 20 MG capsule Take 20 mg by mouth daily.      . fluticasone (FLONASE) 50 MCG/ACT nasal spray Place 2 sprays into the nose daily.  16 g  0  . lidocaine (XYLOCAINE) 2 % solution Take 15 mLs by mouth every 3 (three) hours as needed for pain.  100 mL  0   No current facility-administered medications on file prior to visit.   History   Social History  . Marital Status: Single    Spouse Name: N/A    Number of Children: N/A  . Years of Education: N/A   Occupational History  . 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 retired [employer], [employer]    Social History Main Topics  . Smoking status: Never Smoker   . Smokeless tobacco: Never Used  . Alcohol Use: No  . Drug Use: No  . Sexual Activity: Not on file   Other Topics Concern  . Not on file   Social History Narrative  . No narrative on file       Objective:   Physical Exam  Nursing note and vitals reviewed. Constitutional: She is oriented to person, place, and time. She appears well-developed and well-nourished. No distress.  HENT:  Head: Normocephalic and atraumatic.  Bilateral nares normal. Scant drainage  in left nare.  Eyes: Conjunctivae are normal. No scleral icterus.  Neck: Neck supple.  Cardiovascular: Normal rate and intact distal pulses.   Pulmonary/Chest: Effort normal. No stridor. No respiratory distress.  Abdominal: Normal appearance. She exhibits no distension.  Lymphadenopathy:    She has no cervical adenopathy.  Neurological: She is alert and oriented to person, place, and time.  Skin: Skin is warm and dry. No rash noted. There is erythema.  Diffused erythema left nose with mild swelling without fluctuance. No associated pustules or vesicles.  Psychiatric: She has a normal mood and affect. Her behavior is normal.    Triage Vitals: BP 130/80  Pulse 69  Temp(Src) 98.9 F (37.2 C) (Oral)  Resp 12  Ht 5' 1.85" (1.571 m)  Wt 208 lb  (94.348 kg)  BMI 38.23 kg/m2  LMP 03/29/2006     Assessment & Plan:   1. Pain, nose   2. Cellulitis of nose, external     1.  Nose pain:  New.  Secondary to cellulitis.   2.  Cellulitis Nose L:  New.  Rx for Keflex, Bactrim provided; RTC for fever, worsening redness, swelling.   3.  Contact Dermatitis:  New.  Likely secondary to recent facial; recommend Claritin or Zyrtec daily.  COORDINATION OF CARE: 8:29 AM- Will treat with oral medication for possible MRSA skin infection. Advised pt to get OTC Zyrtec or Claritin. Advised pt to return with any new or worsening symptoms. Pt verbalizes understanding and agrees to plan.  Meds ordered this encounter  Medications  . sulfamethoxazole-trimethoprim (BACTRIM DS,SEPTRA DS) 800-160 MG per tablet    Sig: Take 2 tablets by mouth 2 (two) times daily.    Dispense:  20 tablet    Refill:  0  . cephALEXin (KEFLEX) 500 MG capsule    Sig: Take 1 capsule (500 mg total) by mouth 3 (three) times daily.    Dispense:  30 capsule    Refill:  0

## 2012-11-30 NOTE — Patient Instructions (Signed)
1. Return for fever > 100.4, chills, sweats. 2.  Return for increasing redness, pain, drainage. 3.  Recommend Claritin or Zyrtec 10mg  one tablet daily. 4.  Recommend Benadryl 25mg  one tablet at bedtime.

## 2012-12-26 ENCOUNTER — Emergency Department (HOSPITAL_COMMUNITY): Payer: BC Managed Care – PPO

## 2012-12-26 ENCOUNTER — Emergency Department (HOSPITAL_COMMUNITY)
Admission: EM | Admit: 2012-12-26 | Discharge: 2012-12-27 | Disposition: A | Discharge status: Home / Self Care | Payer: BC Managed Care – PPO | Attending: Emergency Medicine | Admitting: Emergency Medicine

## 2012-12-26 ENCOUNTER — Encounter (HOSPITAL_COMMUNITY): Payer: Self-pay | Admitting: Emergency Medicine

## 2012-12-26 DIAGNOSIS — K219 Gastro-esophageal reflux disease without esophagitis: Secondary | ICD-10-CM | POA: Insufficient documentation

## 2012-12-26 DIAGNOSIS — E785 Hyperlipidemia, unspecified: Secondary | ICD-10-CM | POA: Insufficient documentation

## 2012-12-26 DIAGNOSIS — I10 Essential (primary) hypertension: Secondary | ICD-10-CM | POA: Insufficient documentation

## 2012-12-26 DIAGNOSIS — H905 Unspecified sensorineural hearing loss: Secondary | ICD-10-CM | POA: Insufficient documentation

## 2012-12-26 DIAGNOSIS — R000 Tachycardia, unspecified: Secondary | ICD-10-CM

## 2012-12-26 DIAGNOSIS — F3289 Other specified depressive episodes: Secondary | ICD-10-CM | POA: Insufficient documentation

## 2012-12-26 DIAGNOSIS — R197 Diarrhea, unspecified: Secondary | ICD-10-CM | POA: Insufficient documentation

## 2012-12-26 DIAGNOSIS — J3489 Other specified disorders of nose and nasal sinuses: Secondary | ICD-10-CM | POA: Insufficient documentation

## 2012-12-26 DIAGNOSIS — M1710 Unilateral primary osteoarthritis, unspecified knee: Secondary | ICD-10-CM | POA: Insufficient documentation

## 2012-12-26 DIAGNOSIS — R05 Cough: Secondary | ICD-10-CM | POA: Insufficient documentation

## 2012-12-26 DIAGNOSIS — Z79899 Other long term (current) drug therapy: Secondary | ICD-10-CM | POA: Insufficient documentation

## 2012-12-26 DIAGNOSIS — IMO0002 Reserved for concepts with insufficient information to code with codable children: Secondary | ICD-10-CM | POA: Insufficient documentation

## 2012-12-26 DIAGNOSIS — R059 Cough, unspecified: Secondary | ICD-10-CM

## 2012-12-26 DIAGNOSIS — M899 Disorder of bone, unspecified: Secondary | ICD-10-CM | POA: Insufficient documentation

## 2012-12-26 DIAGNOSIS — F329 Major depressive disorder, single episode, unspecified: Secondary | ICD-10-CM | POA: Insufficient documentation

## 2012-12-26 DIAGNOSIS — R51 Headache: Secondary | ICD-10-CM | POA: Insufficient documentation

## 2012-12-26 DIAGNOSIS — R112 Nausea with vomiting, unspecified: Secondary | ICD-10-CM

## 2012-12-26 LAB — URINE MICROSCOPIC-ADD ON

## 2012-12-26 LAB — URINALYSIS, ROUTINE W REFLEX MICROSCOPIC
Glucose, UA: NEGATIVE mg/dL
Hgb urine dipstick: NEGATIVE
Specific Gravity, Urine: 1.017 (ref 1.005–1.030)
Urobilinogen, UA: 0.2 mg/dL (ref 0.0–1.0)
pH: 6 (ref 5.0–8.0)

## 2012-12-26 LAB — CBC WITH DIFFERENTIAL/PLATELET
Eosinophils Relative: 2 % (ref 0–5)
HCT: 39.7 % (ref 36.0–46.0)
Hemoglobin: 13.6 g/dL (ref 12.0–15.0)
Lymphocytes Relative: 14 % (ref 12–46)
MCV: 82.7 fL (ref 78.0–100.0)
Monocytes Absolute: 0.8 10*3/uL (ref 0.1–1.0)
Monocytes Relative: 8 % (ref 3–12)
Neutro Abs: 7.5 10*3/uL (ref 1.7–7.7)
WBC: 9.9 10*3/uL (ref 4.0–10.5)

## 2012-12-26 LAB — POCT I-STAT, CHEM 8
BUN: 25 mg/dL — ABNORMAL HIGH (ref 6–23)
Calcium, Ion: 1.24 mmol/L — ABNORMAL HIGH (ref 1.12–1.23)
Chloride: 102 mEq/L (ref 96–112)
Creatinine, Ser: 1.3 mg/dL — ABNORMAL HIGH (ref 0.50–1.10)
Glucose, Bld: 104 mg/dL — ABNORMAL HIGH (ref 70–99)
TCO2: 26 mmol/L (ref 0–100)

## 2012-12-26 MED ORDER — SODIUM CHLORIDE 0.9 % IV BOLUS (SEPSIS)
1000.0000 mL | Freq: Once | INTRAVENOUS | Status: AC
  Administered 2012-12-26: 1000 mL via INTRAVENOUS

## 2012-12-26 MED ORDER — ONDANSETRON 4 MG PO TBDP
4.0000 mg | ORAL_TABLET | Freq: Once | ORAL | Status: AC
  Administered 2012-12-26: 4 mg via ORAL
  Filled 2012-12-26: qty 1

## 2012-12-26 MED ORDER — ONDANSETRON 4 MG PO TBDP: 4.0000 mg | ORAL_TABLET | Freq: Three times a day (TID) | ORAL | Status: DC | PRN

## 2012-12-26 MED ORDER — ACETAMINOPHEN 325 MG PO TABS
650.0000 mg | ORAL_TABLET | Freq: Once | ORAL | Status: AC
  Administered 2012-12-26: 650 mg via ORAL
  Filled 2012-12-26: qty 2

## 2012-12-26 MED ORDER — PROMETHAZINE HCL 25 MG/ML IJ SOLN
12.5000 mg | Freq: Once | INTRAMUSCULAR | Status: AC
  Administered 2012-12-26: 12.5 mg via INTRAVENOUS
  Filled 2012-12-26: qty 1

## 2012-12-26 NOTE — ED Notes (Signed)
[facility]/cough/vomiting, they sent her here d/t pulse 140, pt states productive cough x3wks treating with OTC. C/o nausea with headace since yesterday and vomiting today. Pt's pulse 120-140 on cardiac monitor, pt denies chest pain/SOB/diaphoresis, pt is nauseaed.

## 2012-12-26 NOTE — ED Provider Notes (Signed)
CSN: [id number]     Arrival date & time 12/26/12  1411 History   First MD Initiated Contact with Patient 12/26/12 1501     Chief Complaint  Patient presents with  . Tachycardia  . Emesis    HPI  Kristen Scott is a 57 y.o. female with a PMH of HTN, GERD, DJD, HLD, migraine headaches, hearing loss, osteopenia, depression, and arthritis who presents to the ED for evaluation of tachycardia and emesis.  History was provided by the patient.  Patient states she has had a productive cough for the past 3 weeks. She describes green and white colored sputum. She denies any hemoptysis. She denies any chest pain or shortness of breath. She denies any history of any respiratory illnesses including asthma and COPD. She is not a tobacco user. Patient denies any recent travel. She also has had associated rhinorrhea, congestion, and sinus pressure. She also has had a "tension headache" for the past 24 hours. Her headache is located in the temples bilaterally. She has a history of migraine headaches which is not similar to her headaches today. Denies any vision changes or neck pain. She states her headache is not "really bothering her". She is more concerned about her cough and nausea. Patient states she's been nauseated for the past few days. She had 2 episodes of emesis today. She denies any abdominal pain, dysuria, vaginal bleeding, or vaginal discharge. She's had a few intermittent episodes of loose stools but denies any profuse diarrhea. No hematochezia. She denies any fevers.  She's been taking over-the-counter medications to help relieve her symptoms. No known sick contacts. Patient went to a [facility] today for her symptoms and was sent to the ED due to a high heart rate. She denies any history of any cardiac problems in the past.  She denies any history of DVT/PE/clotting.     Past Medical History  Diagnosis Date  . Arthritis   . GERD (gastroesophageal reflux disease)   . DJD (degenerative joint disease)      left knee  . Hyperlipidemia   . Migraine   . Depression   . Osteopenia   . HTN (hypertension)   . Hearing loss, sensorineural    Past Surgical History  Procedure Laterality Date  . Joint replacement  2008    left knee   Family History  Problem Relation Age of Onset  . Dementia Mother   . Diabetes Father   . Diabetes Brother   . Diabetes Sister   . OCD Daughter   . Anxiety disorder Daughter   . Heart attack Maternal Grandfather    History  Substance Use Topics  . Smoking status: Never Smoker   . Smokeless tobacco: Never Used  . Alcohol Use: No   OB History   Grav Para Term Preterm Abortions TAB SAB Ect Mult Living                 Review of Systems  Constitutional: Negative for fever, chills, diaphoresis, activity change, appetite change and fatigue.  HENT: Positive for congestion, rhinorrhea and sinus pressure. Negative for ear pain, sneezing, sore throat and trouble swallowing.   Eyes: Negative for photophobia and visual disturbance.  Respiratory: Positive for cough. Negative for shortness of breath.   Cardiovascular: Negative for chest pain, palpitations and leg swelling.  Gastrointestinal: Positive for nausea, vomiting and diarrhea. Negative for abdominal pain, constipation and rectal pain.  Genitourinary: Negative for dysuria, hematuria, decreased urine volume, vaginal bleeding, vaginal discharge, vaginal pain, menstrual problem  and pelvic pain.  Musculoskeletal: Negative for back pain, gait problem, joint swelling, myalgias, neck pain and neck stiffness.  Skin: Negative for color change and wound.  Neurological: Positive for headaches. Negative for dizziness, syncope, weakness and light-headedness.    Allergies  Relafen  Home Medications   Current Outpatient Rx  Name  Route  Sig  Dispense  Refill  . ALPRAZolam (XANAX) 0.25 MG tablet   Oral   Take 1 tablet by mouth daily.         Marland Kitchen atorvastatin (LIPITOR) 40 MG tablet   Oral   Take 1 tablet (40 mg  total) by mouth daily.   30 tablet   5   . calcium carbonate (TUMS - DOSED IN MG ELEMENTAL CALCIUM) 500 MG chewable tablet   Oral   Chew 1 tablet by mouth daily.         Marland Kitchen dextromethorphan-guaiFENesin (MUCINEX DM) 30-600 MG per 12 hr tablet   Oral   Take 1 tablet by mouth 2 (two) times daily.         . DULoxetine (CYMBALTA) 30 MG capsule   Oral   Take 90 mg by mouth daily.         Marland Kitchen HYDROcodone-acetaminophen (NORCO) 10-325 MG per tablet   Oral   Take 1 tablet by mouth 2 (two) times daily.         . metoprolol succinate (TOPROL-XL) 100 MG 24 hr tablet   Oral   Take 1 tablet (100 mg total) by mouth daily. Take with or immediately following a meal.   30 tablet   11   . omeprazole (PRILOSEC) 20 MG capsule   Oral   Take 20 mg by mouth daily.          BP 163/99  Pulse 139  Resp 20  SpO2 97%  LMP 03/29/2006  Filed Vitals:   12/26/12 2130 12/26/12 2200 12/26/12 2312 12/27/12 0013  BP: 161/113 156/101 150/74 148/85  Pulse: 104 109 102 105  Temp:    98.2 F (36.8 C)  TempSrc:    Oral  Resp: 21 15 13 18   SpO2: 99% 96% 98% 97%    Physical Exam  Nursing note and vitals reviewed. Constitutional: She is oriented to person, place, and time. She appears well-developed and well-nourished. No distress.  HENT:  Head: Normocephalic and atraumatic.  Right Ear: External ear normal.  Left Ear: External ear normal.  Mouth/Throat: Oropharynx is clear and moist. No oropharyngeal exudate.  TM's gray and translucent bilaterally.  No sinus tenderness/pressure.  No tenderness to the scalp throughout.  Nasal congestion.    Eyes: Conjunctivae are normal. Pupils are equal, round, and reactive to light. Right eye exhibits no discharge. Left eye exhibits no discharge.  Neck: Normal range of motion. Neck supple.  Cardiovascular: Regular rhythm, normal heart sounds and intact distal pulses.  Exam reveals no gallop and no friction rub.   No murmur heard. Tachycardia.  Radial and  dorsalis pedis pulses present bilaterally  Pulmonary/Chest: Effort normal and breath sounds normal. No respiratory distress. She has no wheezes. She has no rales. She exhibits no tenderness.  Abdominal: Soft. Bowel sounds are normal. She exhibits no distension and no mass. There is no tenderness. There is no rebound and no guarding.  Musculoskeletal: Normal range of motion. She exhibits no edema and no tenderness.  No calf tenderness or leg edema bilaterally.  Patient able to ambulate without difficulty or ataxia. Strength 5/5 in the UE and LE bilaterally.  Neurological: She is alert and oriented to person, place, and time.  GCS 15. No focal neurological deficits.  CN 2-12 intact.    Skin: Skin is warm and dry. She is not diaphoretic.    ED Course  Procedures (including critical care time) Labs Review Labs Reviewed  CBC WITH DIFFERENTIAL   Imaging Review No results found.  EKG Interpretation   None        Date: 12/27/2012  Rate: 134  Rhythm: sinus tachycardia  QRS Axis: normal  Intervals: QT prolonged (507)   ST/T Wave abnormalities: normal  Conduction Disutrbances:none  Narrative Interpretation:   Old EKG Reviewed: 08/22/2011 - sinus tachycardia HR 116    Results for orders placed during the hospital encounter of 12/26/12  CBC WITH DIFFERENTIAL      Result Value Range   WBC 9.9  4.0 - 10.5 K/uL   RBC 4.80  3.87 - 5.11 MIL/uL   Hemoglobin 13.6  12.0 - 15.0 g/dL   HCT 40.9  81.1 - 91.4 %   MCV 82.7  78.0 - 100.0 fL   MCH 28.3  26.0 - 34.0 pg   MCHC 34.3  30.0 - 36.0 g/dL   RDW 78.2  95.6 - 21.3 %   Platelets 280  150 - 400 K/uL   Neutrophils Relative % 76  43 - 77 %   Neutro Abs 7.5  1.7 - 7.7 K/uL   Lymphocytes Relative 14  12 - 46 %   Lymphs Abs 1.4  0.7 - 4.0 K/uL   Monocytes Relative 8  3 - 12 %   Monocytes Absolute 0.8  0.1 - 1.0 K/uL   Eosinophils Relative 2  0 - 5 %   Eosinophils Absolute 0.2  0.0 - 0.7 K/uL   Basophils Relative 0  0 - 1 %   Basophils  Absolute 0.0  0.0 - 0.1 K/uL  TROPONIN I      Result Value Range   Troponin I <0.30  <0.30 ng/mL  D-DIMER, QUANTITATIVE      Result Value Range   D-Dimer, Quant 0.36  0.00 - 0.48 ug/mL-FEU  URINALYSIS, ROUTINE W REFLEX MICROSCOPIC      Result Value Range   Color, Urine YELLOW  YELLOW   APPearance CLEAR  CLEAR   Specific Gravity, Urine 1.017  1.005 - 1.030   pH 6.0  5.0 - 8.0   Glucose, UA NEGATIVE  NEGATIVE mg/dL   Hgb urine dipstick NEGATIVE  NEGATIVE   Bilirubin Urine NEGATIVE  NEGATIVE   Ketones, ur NEGATIVE  NEGATIVE mg/dL   Protein, ur NEGATIVE  NEGATIVE mg/dL   Urobilinogen, UA 0.2  0.0 - 1.0 mg/dL   Nitrite NEGATIVE  NEGATIVE   Leukocytes, UA SMALL (*) NEGATIVE  URINE MICROSCOPIC-ADD ON      Result Value Range   Squamous Epithelial / LPF RARE  RARE   WBC, UA 3-6  <3 WBC/hpf   Bacteria, UA RARE  RARE  T4, FREE      Result Value Range   Free T4 1.21  0.80 - 1.80 ng/dL  TSH      Result Value Range   TSH 0.466  0.350 - 4.500 uIU/mL  POCT I-STAT, CHEM 8      Result Value Range   Sodium 139  135 - 145 mEq/L   Potassium 3.6  3.5 - 5.1 mEq/L   Chloride 102  96 - 112 mEq/L   BUN 25 (*) 6 - 23 mg/dL   Creatinine, Ser 0.86 (*)  0.50 - 1.10 mg/dL   Glucose, Bld 782 (*) 70 - 99 mg/dL   Calcium, Ion 9.56 (*) 1.12 - 1.23 mmol/L   TCO2 26  0 - 100 mmol/L   Hemoglobin 14.3  12.0 - 15.0 g/dL   HCT 21.3  08.6 - 57.8 %        DG Chest Port 1 View (Final result)  Result time: 12/26/12 15:18:35    Procedure changed from [facility] Chest Portable 2 Views       Final result by Rad Results In Interface (12/26/12 15:18:35)    Narrative:   CLINICAL DATA: Tachycardia, vomiting, cough, history hypertension, hyperlipidemia  EXAM: PORTABLE CHEST - 1 VIEW  COMPARISON: Portable exam 1506 hr without priors for comparison  FINDINGS: Minimally prominent cardiac silhouette likely accentuated by AP lordotic technique.  Mediastinal contours and pulmonary vascularity normal.  Lungs  clear.  No pleural effusion or pneumothorax.  No acute osseous findings.  IMPRESSION: No acute abnormalities.   Electronically Signed By: Ulyses Southward M.D. On: 12/26/2012 15:18    MDM   Arina Torry is a 57 y.o. female with a PMH of HTN, GERD, DJD, HLD, migraine headaches, hearing loss, osteopenia, depression, and arthritis who presents to the ED for evaluation of tachycardia and emesis.  Chest x-ray, troponin, I stat 8 , CBC, orthostatic vitals, and EKG ordered.     Rechecks  4:40 PM = patient asking for something for her headache.  Tylenol ordered.  ODT Zofran ordered for nausea.  HR 100.   5:40 PM = Patient states her headache is improved.  HR 100.  Ordering another liter of fluids.   6:45 PM = Patient resting.  Tachycardic at 108.  On 2nd liter of NS.  Ordering d-dimer.  Pulse ox 97%.   7:39 PM = Patient vomiting. Phenergan ordered for nausea.   9:00 PM = Patient states she feels better.  No nausea.  Just tired.  Wants to wait a little bit and then will try eating ice chips again.  Headache almost gone.   10:15 PM = Patient ambulating to restroom without difficulty.  Checking UA.  Fluid challenge.   11:34 PM = Patient states she is ready to go home.  She still has a mild headache, but no other complaints.  No abdominal pain, headache, or nausea.      Patient evaluated in the ED for cough and emesis.  Chest x-ray negative for an acute cardiopulmonary process.  No respiratory distress or hypoxia.  Patient afebrile and non-toxic in appearance.  She was found to be tachycardic.  Etiology of tachycardia is unclear.  Possibly due to dehydration.  Positive orthostatics.  She had improvement in her tachycardia after 3L of NS.  Patient had no complaints of chest pain/SOB.  Her d-dimer and troponin were negative.  EKG showed sinus tachycardia, which is similar to her previous EKG with sinus tachycardia from 1 year ago.  Thyroid function tests WNL.  Creatinine mildly elevated possibly from  dehydration.  Patient had a few episodes of emesis throughout her ED visit and was able to tolerate PO challenge before discharge.  She had no complaints of abdominal pain and her abdominal exam was benign.  She also complained of a headache with no neurological deficits.  Her headache improved throughout her ED visit.  Patient was given a prescription for zofran and instructed to follow-up with her PCP as soon as possible.  Patient's daughter in the ED and is staying with her mom tonight.  Return  precautions discussed.  Patient in agreement with discharge and plan.     Discharge Medication List as of 12/26/2012 11:46 PM    START taking these medications   Details  ondansetron (ZOFRAN ODT) 4 MG disintegrating tablet Take 1 tablet (4 mg total) by mouth every 8 (eight) hours as needed for nausea., Starting 12/26/2012, Until Discontinued, Print        Final impressions: 1. Tachycardia   2. Cough   3. Nausea and vomiting       Luiz Iron PA-C   This patient was discussed with Dr. Konrad Dolores, PA-C 12/27/12 1231

## 2012-12-26 NOTE — ED Notes (Signed)
Pt c/o productive cough x 3 weeks. [facility] started [facility] and noticed a headache.  Pt  Denies SOB, chest pain, dizziness. NAD noted, A&Ox4. Pt sts she has been taking Mucinex DM at home since symptoms started.

## 2012-12-27 ENCOUNTER — Telehealth (HOSPITAL_COMMUNITY): Payer: Self-pay | Admitting: Emergency Medicine

## 2012-12-27 LAB — T4, FREE: Free T4: 1.21 ng/dL (ref 0.80–1.80)

## 2012-12-27 LAB — TSH: TSH: 0.466 u[IU]/mL (ref 0.350–4.500)

## 2012-12-27 NOTE — ED Provider Notes (Signed)
Medical screening examination/treatment/procedure(s) were performed by non-physician practitioner and as supervising physician I was immediately available for consultation/collaboration.  EKG Interpretation    Date/Time:  Thursday December 26 2012 14:22:23 EST Ventricular Rate:  134 PR Interval:  181 QRS Duration: 69 QT Interval:  339 QTC Calculation: 506 R Axis:   76 Text Interpretation:  Sinus tachycardia No prior for comparison Confirmed by Wilkie Aye  MD, COURTNEY ([id number]) on 12/27/2012 7:40:47 PM             Shon Baton, MD 12/27/12 1946

## 2012-12-28 ENCOUNTER — Ambulatory Visit (INDEPENDENT_AMBULATORY_CARE_PROVIDER_SITE_OTHER): Payer: BC Managed Care – PPO | Admitting: Emergency Medicine

## 2012-12-28 VITALS — BP 124/76 | HR 74 | Temp 98.2°F | Resp 16 | Ht 61.0 in | Wt 202.4 lb

## 2012-12-28 DIAGNOSIS — J209 Acute bronchitis, unspecified: Secondary | ICD-10-CM

## 2012-12-28 DIAGNOSIS — J018 Other acute sinusitis: Secondary | ICD-10-CM

## 2012-12-28 MED ORDER — AMOXICILLIN-POT CLAVULANATE 875-125 MG PO TABS: 1.0000 | ORAL_TABLET | Freq: Two times a day (BID) | ORAL | Status: DC

## 2012-12-28 NOTE — Patient Instructions (Signed)

## 2012-12-28 NOTE — Progress Notes (Signed)
[facility] [address] [place] [address] [phone]  Date:  12/28/2012   Name:  Kristen Scott   DOB:  28-Jun-1955   MRN:  [id number]  PCP:  Dow Adolph, MD    Chief Complaint: Follow-up, Nasal Congestion and Cough   History of Present Illness:  Kristen Scott is a 57 y.o. very pleasant female patient who presents with the following:  Ill for "weeks" with purulent nasal drainage, post nasal drip and a cough.  Cough is also productive of purulent sputum.  She has no wheezing or shortness of breath.  No nausea or vomiting.  No rash or stool change.  [facility] and [facility] to the ER for a HR of 140.  She was discharged with out treatment for her apparent sinusitis because it was not an "emergency".  She never experienced chest pain, tightness or heaviness.  No shortness of breath or wheezing. Stopped taking mucinex dm and her tachycardia resolved.  No improvement with over the counter medications or other home remedies. Denies other complaint or health concern today.   Patient Active Problem List   Diagnosis Date Noted  . Vitamin d deficiency 08/20/2011  . Hyperlipidemia 08/20/2011  . HTN (hypertension) 08/20/2011  . Obesity, Class II, BMI 35-39.9 08/20/2011  . Chronic pain disorder 08/20/2011  . Depression 08/20/2011  . DJD (degenerative joint disease) of knee 08/20/2011  . Arthritis, low back 03/30/2011  . Arthritis of neck 03/30/2011    Past Medical History  Diagnosis Date  . Arthritis   . GERD (gastroesophageal reflux disease)   . DJD (degenerative joint disease)     left knee  . Hyperlipidemia   . Migraine   . Depression   . Osteopenia   . HTN (hypertension)   . Hearing loss, sensorineural     Past Surgical History  Procedure Laterality Date  . Joint replacement  2008    left knee    History  Substance Use Topics  . Smoking status: Never Smoker   . Smokeless tobacco: Never Used  . Alcohol Use: No    Family  History  Problem Relation Age of Onset  . Dementia Mother   . Diabetes Father   . Diabetes Brother   . Diabetes Sister   . OCD Daughter   . Anxiety disorder Daughter   . Heart attack Maternal Grandfather     Allergies  Allergen Reactions  . Relafen [Nabumetone] Rash    Medication list has been reviewed and updated.  Current Outpatient Prescriptions on File Prior to Visit  Medication Sig Dispense Refill  . atorvastatin (LIPITOR) 40 MG tablet Take 1 tablet (40 mg total) by mouth daily.  30 tablet  5  . DULoxetine (CYMBALTA) 30 MG capsule Take 90 mg by mouth daily.      Marland Kitchen HYDROcodone-acetaminophen (NORCO) 10-325 MG per tablet Take 1 tablet by mouth 2 (two) times daily.      . metoprolol succinate (TOPROL-XL) 100 MG 24 hr tablet Take 1 tablet (100 mg total) by mouth daily. Take with or immediately following a meal.  30 tablet  11  . omeprazole (PRILOSEC) 20 MG capsule Take 20 mg by mouth daily.      . ondansetron (ZOFRAN ODT) 4 MG disintegrating tablet Take 1 tablet (4 mg total) by mouth every 8 (eight) hours as needed for nausea.  6 tablet  0  . ALPRAZolam (XANAX) 0.25 MG tablet Take 1 tablet by mouth daily.      Marland Kitchen  calcium carbonate (TUMS - DOSED IN MG ELEMENTAL CALCIUM) 500 MG chewable tablet Chew 1 tablet by mouth daily.      Marland Kitchen dextromethorphan-guaiFENesin (MUCINEX DM) 30-600 MG per 12 hr tablet Take 1 tablet by mouth 2 (two) times daily.       No current facility-administered medications on file prior to visit.    Review of Systems:  As per HPI, otherwise negative.    Physical Examination: Filed Vitals:   12/28/12 0903  BP: 124/76  Pulse: 74  Temp: 98.2 F (36.8 C)  Resp: 16   Filed Vitals:   12/28/12 0903  Height: 5\' 1"  (1.549 m)  Weight: 202 lb 6.4 oz (91.808 kg)   Body mass index is 38.26 kg/(m^2). Ideal Body Weight: Weight in (lb) to have BMI = 25: 132  GEN: WDWN, NAD, Non-toxic, A & O x 3 HEENT: Atraumatic, Normocephalic. Neck supple. No masses, No  LAD. Ears and Nose: No external deformity. CV: RRR, No M/G/R. No JVD. No thrill. No extra heart sounds. PULM: CTA B, no wheezes, crackles, rhonchi. No retractions. No resp. distress. No accessory muscle use. ABD: S, NT, ND, +BS. No rebound. No HSM. EXTR: No c/c/e NEURO Normal gait.  PSYCH: Normally interactive. Conversant. Not depressed or anxious appearing.  Calm demeanor.    Assessment and Plan: Sinusitis Bronchitis augmentin Coricidin HBP  Signed,  Phillips Odor, MD

## 2013-01-11 ENCOUNTER — Ambulatory Visit (INDEPENDENT_AMBULATORY_CARE_PROVIDER_SITE_OTHER): Payer: BC Managed Care – PPO | Admitting: Physician Assistant

## 2013-01-11 VITALS — BP 132/82 | HR 103 | Temp 98.1°F | Resp 16 | Ht 60.5 in | Wt 206.4 lb

## 2013-01-11 DIAGNOSIS — R197 Diarrhea, unspecified: Secondary | ICD-10-CM

## 2013-01-11 DIAGNOSIS — R1031 Right lower quadrant pain: Secondary | ICD-10-CM

## 2013-01-11 DIAGNOSIS — R1084 Generalized abdominal pain: Secondary | ICD-10-CM

## 2013-01-11 DIAGNOSIS — J310 Chronic rhinitis: Secondary | ICD-10-CM

## 2013-01-11 LAB — POCT CBC
Hemoglobin: 12.5 g/dL (ref 12.2–16.2)
Lymph, poc: 1.7 (ref 0.6–3.4)
MCH, POC: 26.7 pg — AB (ref 27–31.2)
MCHC: 30.6 g/dL — AB (ref 31.8–35.4)
MCV: 87.3 fL (ref 80–97)
MID (cbc): 0.7 (ref 0–0.9)
MPV: 7.6 fL (ref 0–99.8)
POC Granulocyte: 5.7 (ref 2–6.9)
POC MID %: 8.4 %M (ref 0–12)
Platelet Count, POC: 340 10*3/uL (ref 142–424)
RDW, POC: 14.2 %
WBC: 8.1 10*3/uL (ref 4.6–10.2)

## 2013-01-11 LAB — CLOSTRIDIUM DIFFICILE EIA: CDIFTX: NEGATIVE

## 2013-01-11 MED ORDER — FLUTICASONE PROPIONATE 50 MCG/ACT NA SUSP: 2.0000 | Freq: Every day | NASAL | Status: DC

## 2013-01-11 MED ORDER — METRONIDAZOLE 500 MG PO TABS: 500.0000 mg | ORAL_TABLET | Freq: Three times a day (TID) | ORAL | Status: DC

## 2013-01-11 NOTE — Patient Instructions (Signed)
Get plenty of rest and drink at least 64 ounces of water daily. 

## 2013-01-11 NOTE — Progress Notes (Signed)
Subjective:    Patient ID: Kristen Scott, female    DOB: [birth year], 57 y.o.   MRN: [id number]  PCP: Dow Adolph, MD  Chief Complaint  Patient presents with  . Diarrhea    X 7 days, started on last day of taking antibiotic  . Medication Refill    Flonase   Medications, allergies, past medical history, surgical history, family history, social history and problem list reviewed and updated.  HPI  Previous notes reviewed. [facility] with [facility] on 12/26/2012, and was sent to the ED due to a pulse of 140 bpm.  However, in the ED the sinus symptoms weren't addressed. She then presented here on 12/28/2012, where she was diagnosed with sinusitis and prescribed Augmentin.  Her sinusitis resolved, but on the last day of the 10-day course, she developed watery diarrhea.  It is of note that she took Bactrim DS and Keflex  X 10 days beginning 11/30/2012 for cellulitis of the nose.  Watery, cramping.  No blood.  Some small amount of mucousy material on her underwear this am upon waking.  No fever, chills.  No nausea or vomiting. Has been taking immodium to get through the day, due to urgency, but hasn't had any stool incontinence.   Review of Systems No chest pain, SOB, HA, dizziness, vision change, N/V, dysuria, urinary urgency or frequency, myalgias, arthralgias or rash.     Objective:   Physical Exam Blood pressure 132/82, pulse 103, temperature 98.1 F (36.7 C), temperature source Oral, resp. rate 16, height 5' 0.5" (1.537 m), weight 206 lb 6.4 oz (93.622 kg), last menstrual period 03/29/2006, SpO2 96.00%. Body mass index is 39.63 kg/(m^2). Well-developed, well nourished WF who is awake, alert and oriented, in NAD. HEENT: [place]/AT, PERRL, EOMI.  Sclera and conjunctiva are clear.  EAC are patent, TMs are normal in appearance. Nasal mucosa is pink and moist. OP is clear. Neck: supple, non-tender, no lymphadenopathy, thyromegaly. Heart: mild tachycardia,  Regular Rhythm, no murmur Lungs: normal effort, CTA Abdomen: normo-active bowel sounds, supple, no mass or organomegaly. Diffuse abdominal tenderness, excluding the LEFT upper quadrant, mildly worse in the RLQ. No referred or rebound tenderness. Mild guarding. No pain with heel taps. Extremities: no cyanosis, clubbing or edema. Skin: warm and dry without rash. Psychologic: good mood and appropriate affect, normal speech and behavior.    Results for orders placed in visit on 01/11/13  POCT CBC      Result Value Range   WBC 8.1  4.6 - 10.2 K/uL   Lymph, poc 1.7  0.6 - 3.4   POC LYMPH PERCENT 21.6  10 - 50 %L   MID (cbc) 0.7  0 - 0.9   POC MID % 8.4  0 - 12 %M   POC Granulocyte 5.7  2 - 6.9   Granulocyte percent 70.0  37 - 80 %G   RBC 4.68  4.04 - 5.48 M/uL   Hemoglobin 12.5  12.2 - 16.2 g/dL   HCT, POC 78.4  69.6 - 47.9 %   MCV 87.3  80 - 97 fL   MCH, POC 26.7 (*) 27 - 31.2 pg   MCHC 30.6 (*) 31.8 - 35.4 g/dL   RDW, POC 29.5     Platelet Count, POC 340  142 - 424 K/uL   MPV 7.6  0 - 99.8 fL       Assessment & Plan:  1. Diarrhea Cover for C dif colitis. - Clostridium difficile EIA - Ova and  parasite screen - Stool culture - metroNIDAZOLE (FLAGYL) 500 MG tablet; Take 1 tablet (500 mg total) by mouth 3 (three) times daily.  Dispense: 21 tablet; Refill: 0  2. Abdominal pain, acute, right lower quadrant See above.  If pain worsens, or if it seems to specifically locate to the RLQ, RTC or go to the ED. Reassured that CBC is normal.  - POCT CBC  3. Generalized abdominal cramping See above  4. Rhinitis - fluticasone (FLONASE) 50 MCG/ACT nasal spray; Place 2 sprays into both nostrils daily.  Dispense: 16 g; Refill: 12   Fernande Bras, PA-C Physician Assistant-Certified [facility]

## 2013-01-14 LAB — OVA AND PARASITE SCREEN

## 2013-03-24 ENCOUNTER — Other Ambulatory Visit: Payer: Self-pay | Admitting: Family Medicine

## 2013-03-26 ENCOUNTER — Other Ambulatory Visit: Payer: Self-pay | Admitting: Family Medicine

## 2013-04-06 ENCOUNTER — Ambulatory Visit (INDEPENDENT_AMBULATORY_CARE_PROVIDER_SITE_OTHER): Payer: BC Managed Care – PPO | Admitting: Emergency Medicine

## 2013-04-06 VITALS — BP 150/93 | HR 81 | Temp 98.2°F | Resp 18 | Wt 208.0 lb

## 2013-04-06 DIAGNOSIS — H1030 Unspecified acute conjunctivitis, unspecified eye: Secondary | ICD-10-CM

## 2013-04-06 MED ORDER — TOBRAMYCIN 0.3 % OP SOLN: 1.0000 [drp] | OPHTHALMIC | Status: DC

## 2013-04-06 NOTE — Patient Instructions (Signed)

## 2013-04-06 NOTE — Progress Notes (Signed)
[facility] [address], [place] [address] [phone]  Date:  04/06/2013   Name:  Kristen Scott   DOB:  [birth year]   MRN:  [id number]  PCP:  Dow Adolph, MD    Chief Complaint: Eye Pain   History of Present Illness:  Kristen Scott is a 58 y.o. 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  Some gluing this morning.   No fever or chills, cough, wheezing or shortness of breath, no coryza.  No visual symptoms.  No improvement with over the counter medications or other home remedies. Denies other complaint or health concern today.   Patient Active Problem List   Diagnosis Date Noted  . Vitamin d deficiency 08/20/2011  . Hyperlipidemia 08/20/2011  . HTN (hypertension) 08/20/2011  . Obesity, Class II, BMI 35-39.9 08/20/2011  . Chronic pain disorder 08/20/2011  . Depression 08/20/2011  . DJD (degenerative joint disease) of knee 08/20/2011  . Arthritis, low back 03/30/2011  . Arthritis of neck 03/30/2011    Past Medical History  Diagnosis Date  . Arthritis   . GERD (gastroesophageal reflux disease)   . DJD (degenerative joint disease)     left knee  . Hyperlipidemia   . Migraine   . Depression   . Osteopenia   . HTN (hypertension)   . Hearing loss, sensorineural     Past Surgical History  Procedure Laterality Date  . Joint replacement  2008    left knee    History  Substance Use Topics  . Smoking status: Never Smoker   . Smokeless tobacco: Never Used  . Alcohol Use: No    Family History  Problem Relation Age of Onset  . Dementia Mother   . Diabetes Father   . Diabetes Brother   . Mental illness Daughter     anxiety, OCD  . Heart attack Maternal Grandfather   . Diabetes Sister   . Diabetes Brother     Allergies  Allergen Reactions  . Relafen [Nabumetone] Rash    Medication list has been reviewed and updated.  Current Outpatient Prescriptions on  File Prior to Visit  Medication Sig Dispense Refill  . ALPRAZolam (XANAX) 0.25 MG tablet Take 1 tablet by mouth daily.      Marland Kitchen atorvastatin (LIPITOR) 40 MG tablet Take 1 tablet (40 mg total) by mouth daily. PATIENT NEEDS OFFICE VISIT FOR ADDITIONAL REFILLS  30 tablet  0  . DULoxetine (CYMBALTA) 30 MG capsule Take 90 mg by mouth daily.      . fluticasone (FLONASE) 50 MCG/ACT nasal spray Place 2 sprays into both nostrils daily.  16 g  12  . HYDROcodone-acetaminophen (NORCO) 10-325 MG per tablet Take 1 tablet by mouth 2 (two) times daily.      . metoprolol succinate (TOPROL-XL) 100 MG 24 hr tablet Take 1 tablet (100 mg total) by mouth daily. PATIENT NEEDS OFFICE VISIT FOR ADDITIONAL REFILLS  30 tablet  0  . omeprazole (PRILOSEC) 20 MG capsule Take 20 mg by mouth daily.       No current facility-administered medications on file prior to visit.    Review of Systems:  As per HPI, otherwise negative.    Physical Examination: Filed Vitals:   04/06/13 0843  BP: 150/93  Pulse: 81  Temp: 98.2 F (36.8 C)  Resp: 18   Filed Vitals:   04/06/13 0843  Weight: 208 lb (94.348 kg)   Body  mass index is 39.94 kg/(m^2). Ideal Body Weight:    GEN: WDWN, NAD, Non-toxic, A & O x 3 HEENT: Atraumatic, Normocephalic. Neck supple. No masses, No LAD.  Bilateral conjunctivitis Ears and Nose: No external deformity. CV: RRR, No M/G/R. No JVD. No thrill. No extra heart sounds. PULM: CTA B, no wheezes, crackles, rhonchi. No retractions. No resp. distress. No accessory muscle use. ABD: S, NT, ND, +BS. No rebound. No HSM. EXTR: No c/c/e NEURO Normal gait.  PSYCH: Normally interactive. Conversant. Not depressed or anxious appearing.  Calm demeanor.    Assessment and Plan: Conjunctivitis tobrex  Signed,  Phillips OdorJeffery Anderson, MD

## 2013-05-01 ENCOUNTER — Ambulatory Visit (INDEPENDENT_AMBULATORY_CARE_PROVIDER_SITE_OTHER): Payer: BC Managed Care – PPO | Admitting: Family Medicine

## 2013-05-01 VITALS — BP 112/68 | HR 99 | Temp 98.1°F | Resp 16 | Ht 61.25 in | Wt 212.4 lb

## 2013-05-01 DIAGNOSIS — Z91148 Patient's other noncompliance with medication regimen for other reason: Secondary | ICD-10-CM

## 2013-05-01 DIAGNOSIS — I10 Essential (primary) hypertension: Secondary | ICD-10-CM

## 2013-05-01 DIAGNOSIS — E785 Hyperlipidemia, unspecified: Secondary | ICD-10-CM

## 2013-05-01 DIAGNOSIS — Z9114 Patient's other noncompliance with medication regimen: Secondary | ICD-10-CM

## 2013-05-01 MED ORDER — ATORVASTATIN CALCIUM 40 MG PO TABS: 40.0000 mg | ORAL_TABLET | Freq: Every day | ORAL | Status: DC

## 2013-05-01 MED ORDER — METOPROLOL SUCCINATE ER 100 MG PO TB24: 100.0000 mg | ORAL_TABLET | Freq: Every day | ORAL | Status: DC

## 2013-05-01 NOTE — Progress Notes (Signed)
Chief Complaint:  Chief Complaint  Patient presents with  . Medication Refill    lipitor, metoprolol    HPI: Kristen Scott is a 58 y.o. female who is here for  Medication refills  HTN and hyperlipidemia Does not take BP at home Need refills, no SEs.  Has eaten takes meds about 3-4 times a week. At most 5x per week  Past Medical History  Diagnosis Date  . Arthritis   . GERD (gastroesophageal reflux disease)   . DJD (degenerative joint disease)     left knee  . Hyperlipidemia   . Migraine   . Depression   . Osteopenia   . HTN (hypertension)   . Hearing loss, sensorineural    Past Surgical History  Procedure Laterality Date  . Joint replacement  2008    left knee   History   Social History  . Marital Status: Single    Spouse Name: n/a    Number of Children: 2  . Years of Education: N/A   Occupational History  . 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     Social History Main Topics  . Smoking status: Never Smoker   . Smokeless tobacco: Never Used  . Alcohol Use: No  . Drug Use: No  . Sexual Activity: None   Other Topics Concern  . None   Social History Narrative   Divorced 03/2012.   Lives alone. Her children live in [place], [place] and [place], [place].   Family History  Problem Relation Age of Onset  . Dementia Mother   . Diabetes Father   . Diabetes Brother   . Mental illness Daughter     anxiety, OCD  . Heart attack Maternal Grandfather   . Diabetes Sister   . Diabetes Brother    Allergies  Allergen Reactions  . Relafen [Nabumetone] Rash   Prior to Admission medications   Medication Sig Start Date End Date Taking? Authorizing Provider  ALPRAZolam Prudy Feeler) 0.25 MG tablet Take 1 tablet by mouth daily. 12/05/12  Yes Historical Provider, MD  atorvastatin (LIPITOR) 40 MG tablet Take 1 tablet (40 mg total) by mouth daily. PATIENT NEEDS OFFICE VISIT FOR ADDITIONAL REFILLS   Yes Maurice March, MD  DULoxetine (CYMBALTA) 30  MG capsule Take 90 mg by mouth daily. 08/17/11  Yes Maurice March, MD  fluticasone [facility]) 50 MCG/ACT nasal spray Place 2 sprays into both nostrils daily. 01/11/13  Yes Chelle S Jeffery, PA-C  HYDROcodone-acetaminophen (NORCO) 10-325 MG per tablet Take 1 tablet by mouth 2 (two) times daily.   Yes Historical Provider, MD  metoprolol succinate (TOPROL-XL) 100 MG 24 hr tablet Take 1 tablet (100 mg total) by mouth daily. PATIENT NEEDS OFFICE VISIT FOR ADDITIONAL REFILLS   Yes Maurice March, MD  omeprazole (PRILOSEC) 20 MG capsule Take 20 mg by mouth daily.   Yes Historical Provider, MD     ROS: The patient denies fevers, chills, night sweats, unintentional weight loss, chest pain, palpitations, wheezing, dyspnea on exertion, nausea, vomiting, abdominal pain, dysuria, hematuria, melena, numbness, weakness, or tingling.   All other systems have been reviewed and were otherwise negative with the exception of those mentioned in the HPI and as above.    PHYSICAL EXAM: Filed Vitals:   05/01/13 1716  BP: 112/68  Pulse: 99  Temp: 98.1 F (36.7 C)  Resp: 16   Filed Vitals:   05/01/13 1716  Height: 5' 1.25" (1.556 m)  Weight: 212 lb 6.4 oz (96.344 kg)   Body mass index is 39.79 kg/(m^2).  General: Alert, no acute distress HEENT:  Normocephalic, atraumatic, oropharynx patent. EOMI, PERRLA Cardiovascular:  Regular rate and rhythm, no rubs murmurs or gallops.  No Carotid bruits, radial pulse intact. No pedal edema.  Respiratory: Clear to auscultation bilaterally.  No wheezes, rales, or rhonchi.  No cyanosis, no use of accessory musculature GI: No organomegaly, abdomen is soft and non-tender, positive bowel sounds.  No masses. Skin: No rashes. Neurologic: Facial musculature symmetric. Psychiatric: Patient is appropriate throughout our interaction. Lymphatic: No cervical lymphadenopathy Musculoskeletal: Gait intact.   LABS: Results for orders placed in visit on 01/11/13    CLOSTRIDIUM DIFFICILE EIA      Result Value Ref Range   CDIFTX NEGATIVE    OVA AND PARASITE SCREEN      Result Value Ref Range   OP No Ova or Parasites Seen      OP Moderate     OP WBC    STOOL CULTURE      Result Value Ref Range   Organism ID, Bacteria No Salmonella,Shigella,Campylobacter,Yersinia,or     Organism ID, Bacteria No E.coli [id number] isolated.    POCT CBC      Result Value Ref Range   WBC 8.1  4.6 - 10.2 K/uL   Lymph, poc 1.7  0.6 - 3.4   POC LYMPH PERCENT 21.6  10 - 50 %L   MID (cbc) 0.7  0 - 0.9   POC MID % 8.4  0 - 12 %M   POC Granulocyte 5.7  2 - 6.9   Granulocyte percent 70.0  37 - 80 %G   RBC 4.68  4.04 - 5.48 M/uL   Hemoglobin 12.5  12.2 - 16.2 g/dL   HCT, POC 14.740.9  82.937.7 - 47.9 %   MCV 87.3  80 - 97 fL   MCH, POC 26.7 (*) 27 - 31.2 pg   MCHC 30.6 (*) 31.8 - 35.4 g/dL   RDW, POC 56.214.2     Platelet Count, POC 340  142 - 424 K/uL   MPV 7.6  0 - 99.8 fL     EKG/XRAY:   Primary [facility]   ASSESSMENT/PLAN: Encounter Diagnoses  Name Primary?  . Other and unspecified hyperlipidemia Yes  . HTN (hypertension)   . Non compliance w medication regimen    Refilled meds for 1 year Since she is not taking meds regular, only taking it  4-5 time per  Week then will ask her to take it consistently and then follow-up in 1 monthf or fasting blood work.  She can just get labs only, orders put in the computer Otherwise F/u in 6 months   Gross sideeffects, risk and benefits, and alternatives of medications d/w patient. Patient is aware that all medications have potential sideeffects and we are unable to predict every sideeffect or drug-drug interaction that may occur.  Kristen Scott, Kristen Blyden PHUONG, DO 05/03/2013 9:38 AM

## 2013-12-22 ENCOUNTER — Encounter: Payer: Self-pay | Admitting: Family Medicine

## 2013-12-22 ENCOUNTER — Ambulatory Visit (INDEPENDENT_AMBULATORY_CARE_PROVIDER_SITE_OTHER): Payer: BC Managed Care – PPO | Admitting: Family Medicine

## 2013-12-22 VITALS — BP 150/100 | HR 90 | Temp 97.9°F | Resp 18 | Ht 61.0 in | Wt 215.0 lb

## 2013-12-22 DIAGNOSIS — E785 Hyperlipidemia, unspecified: Secondary | ICD-10-CM

## 2013-12-22 DIAGNOSIS — Z23 Encounter for immunization: Secondary | ICD-10-CM

## 2013-12-22 DIAGNOSIS — E669 Obesity, unspecified: Secondary | ICD-10-CM

## 2013-12-22 DIAGNOSIS — I10 Essential (primary) hypertension: Secondary | ICD-10-CM

## 2013-12-22 DIAGNOSIS — Z1329 Encounter for screening for other suspected endocrine disorder: Secondary | ICD-10-CM

## 2013-12-22 DIAGNOSIS — Z130 Encounter for screening for diseases of the blood and blood-forming organs and certain disorders involving the immune mechanism: Secondary | ICD-10-CM

## 2013-12-22 DIAGNOSIS — Z0000 Encounter for general adult medical examination without abnormal findings: Secondary | ICD-10-CM

## 2013-12-22 LAB — COMPREHENSIVE METABOLIC PANEL
ALBUMIN: 4.5 g/dL (ref 3.5–5.2)
ALT: 18 U/L (ref 0–35)
AST: 20 U/L (ref 0–37)
Alkaline Phosphatase: 89 U/L (ref 39–117)
BUN: 16 mg/dL (ref 6–23)
CHLORIDE: 104 meq/L (ref 96–112)
CO2: 22 meq/L (ref 19–32)
Calcium: 9.6 mg/dL (ref 8.4–10.5)
Creat: 0.91 mg/dL (ref 0.50–1.10)
GLUCOSE: 107 mg/dL — AB (ref 70–99)
POTASSIUM: 3.8 meq/L (ref 3.5–5.3)
SODIUM: 140 meq/L (ref 135–145)
Total Bilirubin: 0.6 mg/dL (ref 0.2–1.2)
Total Protein: 7.8 g/dL (ref 6.0–8.3)

## 2013-12-22 LAB — CBC WITH DIFFERENTIAL/PLATELET
Basophils Absolute: 0 10*3/uL (ref 0.0–0.1)
Basophils Relative: 0 % (ref 0–1)
Eosinophils Absolute: 0 10*3/uL (ref 0.0–0.7)
Eosinophils Relative: 0 % (ref 0–5)
HCT: 41.4 % (ref 36.0–46.0)
HEMOGLOBIN: 14 g/dL (ref 12.0–15.0)
LYMPHS ABS: 1.5 10*3/uL (ref 0.7–4.0)
LYMPHS PCT: 21 % (ref 12–46)
MCH: 27.9 pg (ref 26.0–34.0)
MCHC: 33.8 g/dL (ref 30.0–36.0)
MCV: 82.5 fL (ref 78.0–100.0)
MONO ABS: 0.5 10*3/uL (ref 0.1–1.0)
MONOS PCT: 7 % (ref 3–12)
MPV: 8.8 fL — AB (ref 9.4–12.4)
NEUTROS ABS: 5.2 10*3/uL (ref 1.7–7.7)
NEUTROS PCT: 72 % (ref 43–77)
Platelets: 315 10*3/uL (ref 150–400)
RBC: 5.02 MIL/uL (ref 3.87–5.11)
RDW: 13.8 % (ref 11.5–15.5)
WBC: 7.2 10*3/uL (ref 4.0–10.5)

## 2013-12-22 LAB — TSH: TSH: 1.226 u[IU]/mL (ref 0.350–4.500)

## 2013-12-22 LAB — LIPID PANEL
CHOLESTEROL: 189 mg/dL (ref 0–200)
HDL: 59 mg/dL (ref >39)
LDL Cholesterol: 95 mg/dL (ref 0–99)
TRIGLYCERIDES: 175 mg/dL — AB (ref <150)
Total CHOL/HDL Ratio: 3.2 Ratio
VLDL: 35 mg/dL (ref 0–40)

## 2013-12-22 NOTE — Patient Instructions (Signed)
Great to see you today!  Please check your BP a few times a week and write it down.   Please either mail me a list of your readings, or call or send a mychart message.  We can then decide if we need to change your medications.   Also, we need to build more exercise into your daily routine. Start with 20 minutes 4-5 days a week. Even 10 minutes is a good start!  You might want to invest in a treadmill to make it easy for you to walk whatever the weather.  Watching a good show on your ipad is a great way to make it fun!    Your refills are actually up to date right now.  When you need new refills in the spring just have the pharm send me a message

## 2013-12-22 NOTE — Progress Notes (Signed)
[facility] [address], [place] [id number] [phone]  Date:  12/22/2013   Name:  Kristen NipJanice Scott   DOB:  March 10, 1955   MRN:  [id number]  PCP:  Dow AdolphMCPHERSON,BARBARA, MD    Chief Complaint: Annual Exam   History of Present Illness:  Kristen Scott is a 58 y.o. very pleasant female patient who presents with the following:  Here today as a new pt to me; she would like a CPE.  She was last seen here in March of this year.   She is still taking her lipitor and toprol. She does check her BP on occasion but is not able to recall what reading she might get.   She is fasting this am.   Her flu shot is done, as is her Tdap and zoster.   She gets [facility] colon[facility] every 5 years [facility] [place].   [facility]   She sees her GYN Dr. Primitivo GauzeFletcher for her pap- seen annually  She does admit that she feels nervous this am.  She took just a part of a xanax pill.  She notes that her BP and pulse are not normally so high.    She has been doing well with taking her lipitor; she is being more consistent.  She hopes that her choleserol will reflect this improvement  Admits she really does not exercise much.  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 Vitamin d deficiency 08/20/2011  . Hyperlipidemia 08/20/2011  . HTN (hypertension) 08/20/2011  . Obesity, Class II, BMI 35-39.9 08/20/2011  . Chronic pain disorder 08/20/2011  . Depression 08/20/2011  . DJD (degenerative joint disease) of knee 08/20/2011  . Arthritis, low back 03/30/2011  . Arthritis of neck 03/30/2011    Past Medical History  Diagnosis Date  . Arthritis   . GERD (gastroesophageal reflux disease)   .  DJD (degenerative joint disease)     left knee  . Hyperlipidemia   . Migraine   . Depression   . Osteopenia   . HTN (hypertension)   . Hearing loss, sensorineural     Past Surgical History  Procedure Laterality Date  . Joint replacement  2008    left knee    History  Substance Use Topics  . Smoking status: Never Smoker   . Smokeless tobacco: Never Used  . Alcohol Use: No    Family History  Problem Relation Age of Onset  . Dementia Mother   . Diabetes Father   . Diabetes Brother   . Mental illness Daughter     anxiety, OCD  . Heart attack Maternal Grandfather   . Diabetes Sister   . Diabetes Brother     Allergies  Allergen Reactions  . Relafen [Nabumetone] Rash    Medication list has been reviewed and updated.  Current Outpatient Prescriptions on File Prior to Visit  Medication Sig Dispense Refill  . ALPRAZolam (XANAX) 0.25 MG tablet Take 1 tablet by mouth daily.    Marland Kitchen. atorvastatin (LIPITOR) 40 MG tablet Take 1 tablet (40 mg total) by mouth daily. 90 tablet 3  . DULoxetine (CYMBALTA) 30 MG capsule  Take 90 mg by mouth daily.    . fluticasone (FLONASE) 50 MCG/ACT nasal spray Place 2 sprays into both nostrils daily. 16 g 12  . HYDROcodone-acetaminophen (NORCO) 10-325 MG per tablet Take 1 tablet by mouth 2 (two) times daily.    . metoprolol succinate (TOPROL-XL) 100 MG 24 hr tablet Take 1 tablet (100 mg total) by mouth daily. 90 tablet 3  . omeprazole (PRILOSEC) 20 MG capsule Take 20 mg by mouth daily.     No current facility-administered medications on file prior to visit.    Review of Systems:  As per HPI- otherwise negative.   Physical Examination: Filed Vitals:   12/22/13 0919  BP: 170/106  Pulse: 109  Temp: 97.9 F (36.6 C)  Resp: 18   Filed Vitals:   12/22/13 0919  Height: 5\' 1"  (1.549 m)  Weight: 215 lb (97.523 kg)   Body mass index is 40.64 kg/(m^2). Ideal Body Weight: Weight in (lb) to have BMI = 25: 132  GEN: WDWN, NAD, Non-toxic, A & O  x 3, obese, looks well HEENT: Atraumatic, Normocephalic. Neck supple. No masses, No LAD.  Bilateral TM wnl, oropharynx normal.  PEERL,EOMI.   Ears and Nose: No external deformity. CV: RRR, No M/G/R. No JVD. No thrill. No extra heart sounds. PULM: CTA B, no wheezes, crackles, rhonchi. No retractions. No resp. distress. No accessory muscle use. ABD: S, NT, ND. No rebound. No HSM. EXTR: No c/c/e NEURO Normal gait.  PSYCH: Normally interactive. Conversant. Not depressed or anxious appearing.  Calm demeanor.    Assessment and Plan: Physical exam  Need for prophylactic vaccination and inoculation against influenza - Plan: CANCELED: Flu Vaccine QUAD 36+ mos IM  Screening for deficiency anemia - Plan: CBC with Differential  Screening for hypothyroidism - Plan: TSH  Obesity - Plan: Comprehensive metabolic panel  Hyperlipidemia - Plan: Lipid panel  Essential hypertension - Plan: Comprehensive metabolic panel await her labs and follow-up with her.  Flu shot, encouraged exercise and BP monitoring  See patient instructions for more details.       Signed Abbe AmsterdamJessica Copland, MD

## 2014-03-05 ENCOUNTER — Encounter: Payer: Self-pay | Admitting: *Deleted

## 2014-03-05 DIAGNOSIS — K219 Gastro-esophageal reflux disease without esophagitis: Secondary | ICD-10-CM | POA: Insufficient documentation

## 2014-05-07 ENCOUNTER — Other Ambulatory Visit: Payer: Self-pay | Admitting: Family Medicine

## 2014-05-11 ENCOUNTER — Other Ambulatory Visit: Payer: Self-pay | Admitting: Family Medicine

## 2014-07-15 ENCOUNTER — Encounter: Payer: Self-pay | Admitting: *Deleted

## 2014-08-10 ENCOUNTER — Other Ambulatory Visit: Payer: Self-pay | Admitting: Family Medicine

## 2014-10-05 ENCOUNTER — Encounter: Payer: Self-pay | Admitting: Family Medicine

## 2014-10-05 ENCOUNTER — Ambulatory Visit (INDEPENDENT_AMBULATORY_CARE_PROVIDER_SITE_OTHER): Payer: BC Managed Care – PPO | Admitting: Family Medicine

## 2014-10-05 VITALS — BP 122/71 | HR 73 | Temp 98.0°F | Resp 16 | Ht 61.0 in | Wt 210.0 lb

## 2014-10-05 DIAGNOSIS — Z130 Encounter for screening for diseases of the blood and blood-forming organs and certain disorders involving the immune mechanism: Secondary | ICD-10-CM

## 2014-10-05 DIAGNOSIS — Z119 Encounter for screening for infectious and parasitic diseases, unspecified: Secondary | ICD-10-CM

## 2014-10-05 DIAGNOSIS — R7309 Other abnormal glucose: Secondary | ICD-10-CM | POA: Diagnosis not present

## 2014-10-05 DIAGNOSIS — I10 Essential (primary) hypertension: Secondary | ICD-10-CM | POA: Diagnosis not present

## 2014-10-05 DIAGNOSIS — Z131 Encounter for screening for diabetes mellitus: Secondary | ICD-10-CM | POA: Diagnosis not present

## 2014-10-05 DIAGNOSIS — E785 Hyperlipidemia, unspecified: Secondary | ICD-10-CM

## 2014-10-05 DIAGNOSIS — R7303 Prediabetes: Secondary | ICD-10-CM | POA: Insufficient documentation

## 2014-10-05 LAB — COMPREHENSIVE METABOLIC PANEL
ALK PHOS: 90 U/L (ref 33–130)
ALT: 14 U/L (ref 6–29)
AST: 14 U/L (ref 10–35)
Albumin: 4.4 g/dL (ref 3.6–5.1)
BUN: 23 mg/dL (ref 7–25)
CO2: 26 mmol/L (ref 20–31)
CREATININE: 0.85 mg/dL (ref 0.50–0.99)
Calcium: 9.3 mg/dL (ref 8.6–10.4)
Chloride: 105 mmol/L (ref 98–110)
Glucose, Bld: 100 mg/dL — ABNORMAL HIGH (ref 65–99)
Potassium: 4.1 mmol/L (ref 3.5–5.3)
SODIUM: 142 mmol/L (ref 135–146)
Total Bilirubin: 0.5 mg/dL (ref 0.2–1.2)
Total Protein: 7 g/dL (ref 6.1–8.1)

## 2014-10-05 LAB — LIPID PANEL
Cholesterol: 142 mg/dL (ref 125–200)
HDL: 42 mg/dL — ABNORMAL LOW (ref >=46)
LDL Cholesterol: 65 mg/dL (ref <130)
Total CHOL/HDL Ratio: 3.4 Ratio (ref <=5.0)
Triglycerides: 176 mg/dL — ABNORMAL HIGH (ref <150)
VLDL: 35 mg/dL — ABNORMAL HIGH (ref <30)

## 2014-10-05 LAB — POCT GLYCOSYLATED HEMOGLOBIN (HGB A1C): Hemoglobin A1C: 5.8

## 2014-10-05 LAB — HEPATITIS C ANTIBODY: HCV AB: NEGATIVE

## 2014-10-05 LAB — CBC
HCT: 36.8 % (ref 36.0–46.0)
Hemoglobin: 12.2 g/dL (ref 12.0–15.0)
MCH: 27.1 pg (ref 26.0–34.0)
MCHC: 33.2 g/dL (ref 30.0–36.0)
MCV: 81.8 fL (ref 78.0–100.0)
MPV: 9.1 fL (ref 8.6–12.4)
PLATELETS: 280 10*3/uL (ref 150–400)
RBC: 4.5 MIL/uL (ref 3.87–5.11)
RDW: 13.9 % (ref 11.5–15.5)
WBC: 6.5 10*3/uL (ref 4.0–10.5)

## 2014-10-05 LAB — RPR

## 2014-10-05 LAB — HIV ANTIBODY (ROUTINE TESTING W REFLEX): HIV 1&2 Ab, 4th Generation: NONREACTIVE

## 2014-10-05 MED ORDER — METOPROLOL SUCCINATE ER 100 MG PO TB24: 100.0000 mg | ORAL_TABLET | Freq: Every day | ORAL | Status: DC

## 2014-10-05 NOTE — Patient Instructions (Signed)
I will be in touch with your labs asap Good luck with the sale of your house- I hope that all goes well Please sign up for mychart to receive your labs

## 2014-10-05 NOTE — Progress Notes (Addendum)
[facility] [address], [place] [address] [phone]  Date:  10/05/2014   Name:  Kristen Scott   DOB:  August 08, 1955   MRN:  [id number]  PCP:  Dow Adolph, MD    Chief Complaint: Medication Refill   History of Present Illness:  Kristen Scott is a 59 y.o. very pleasant female patient who presents with the following:  Here today to follow-up.  History of obesity, HTN, high cholesterol. She is fasting today for labs No history of DM but does need screening today  Apparently she may have tested positive for syphilis- in 1970s prior to her wedding.  However she was never treated or followed up, never had any symptoms. We are not sure if she truly had syphilis or not, but she would ike to be tested today She is otherwise doing well She is a non-smoker Does see Dr. Ethelene Hal for her chronic back pain- he treats her with injections and medications as needed     Patient Active Problem List   Diagnosis Date Noted  . GERD (gastroesophageal reflux disease) 03/05/2014  . Vitamin D deficiency 08/20/2011  . Hyperlipidemia 08/20/2011  . HTN (hypertension) 08/20/2011  . Obesity, Class II, BMI 35-39.9 08/20/2011  . Chronic pain disorder 08/20/2011  . Depression 08/20/2011  . DJD (degenerative joint disease) of knee 08/20/2011  . Arthritis, low back 03/30/2011  . Arthritis of neck 03/30/2011    Past Medical History  Diagnosis Date  . Arthritis   . GERD (gastroesophageal reflux disease)   . DJD (degenerative joint disease)     left knee  . Hyperlipidemia   . Migraine   . Depression   . Osteopenia   . HTN (hypertension)   . Hearing loss, sensorineural     Past Surgical History  Procedure Laterality Date  . Joint replacement  2008    left knee    Social History  Substance Use Topics  . Smoking status: Never Smoker   . Smokeless tobacco: Never Used  . Alcohol Use: No    Family History  Problem Relation Age of Onset  . Dementia Mother   .  Diabetes Father   . Diabetes Brother   . Mental illness Daughter     anxiety, OCD  . Heart attack Maternal Grandfather   . Diabetes Sister   . Diabetes Brother     Allergies  Allergen Reactions  . Relafen [Nabumetone] Rash    Medication list has been reviewed and updated.  Current Outpatient Prescriptions on File Prior to Visit  Medication Sig Dispense Refill  . atorvastatin (LIPITOR) 40 MG tablet take 1 tablet by mouth once daily 90 tablet 0  . DULoxetine (CYMBALTA) 30 MG capsule Take 90 mg by mouth daily.    Marland Kitchen HYDROcodone-acetaminophen (NORCO) 10-325 MG per tablet Take 1 tablet by mouth 2 (two) times daily.    . metoprolol succinate (TOPROL-XL) 100 MG 24 hr tablet Take 1 tablet (100 mg total) by mouth daily. PATIENT NEEDS OFFICE VISIT FOR ADDITIONAL REFILLS 30 tablet 0  . omeprazole (PRILOSEC) 20 MG capsule Take 20 mg by mouth daily.     No current facility-administered medications on file prior to visit.    Review of Systems:  As per HPI- otherwise negative.   Physical Examination: Filed Vitals:   10/05/14 0921  BP: 122/71  Pulse: 73  Temp: 98 F (36.7 C)  Resp: 16   Filed Vitals:   10/05/14 0921  Height: 5\' 1"  (1.549 m)  Weight:  210 lb (95.255 kg)   Body mass index is 39.7 kg/(m^2). Ideal Body Weight: Weight in (lb) to have BMI = 25: 132  GEN: WDWN, NAD, Non-toxic, A & O x 3, obese, looks well HEENT: Atraumatic, Normocephalic. Neck supple. No masses, No LAD. Ears and Nose: No external deformity. CV: RRR, No M/G/R. No JVD. No thrill. No extra heart sounds. PULM: CTA B, no wheezes, crackles, rhonchi. No retractions. No resp. distress. No accessory muscle use. EXTR: No c/c/e NEURO Normal gait.  PSYCH: Normally interactive. Conversant. Not depressed or anxious appearing.  Calm demeanor.    Assessment and Plan: Screening examination for infectious disease - Plan: HIV antibody, Hepatitis C antibody, RPR  Essential hypertension - Plan: metoprolol succinate  (TOPROL-XL) 100 MG 24 hr tablet  Screening for diabetes mellitus - Plan: Comprehensive metabolic panel, Hemoglobin A1c  Hyperlipidemia - Plan: Lipid panel  Screening for deficiency anemia - Plan: CBC  I will be in touch with her labs BP well controlled, refilled toprol Discussed possible syphilis history - this seems unlikely as she was never treated, ? Mistake or miscommunication.  In any case will screen her today, also HIV and Hep C DO NOT MAIL LABS- SHE IS MOVING Signed Abbe Amsterdam, MD  Received her labs 8/29- called to go over She does not have syphilis HDL is a little low, she does have pre-diabetes Work on diet, exercise, weight loss and recheck in 6 months   Results for orders placed or performed in visit on 10/05/14  HIV antibody  Result Value Ref Range   HIV 1&2 Ab, 4th Generation NONREACTIVE NONREACTIVE  Hepatitis C antibody  Result Value Ref Range   HCV Ab NEGATIVE NEGATIVE  RPR  Result Value Ref Range   RPR Ser Ql NON REAC NON REAC  CBC  Result Value Ref Range   WBC 6.5 4.0 - 10.5 K/uL   RBC 4.50 3.87 - 5.11 MIL/uL   Hemoglobin 12.2 12.0 - 15.0 g/dL   HCT 16.1 09.6 - 04.5 %   MCV 81.8 78.0 - 100.0 fL   MCH 27.1 26.0 - 34.0 pg   MCHC 33.2 30.0 - 36.0 g/dL   RDW 40.9 81.1 - 91.4 %   Platelets 280 150 - 400 K/uL   MPV 9.1 8.6 - 12.4 fL  Comprehensive metabolic panel  Result Value Ref Range   Sodium 142 135 - 146 mmol/L   Potassium 4.1 3.5 - 5.3 mmol/L   Chloride 105 98 - 110 mmol/L   CO2 26 20 - 31 mmol/L   Glucose, Bld 100 (H) 65 - 99 mg/dL   BUN 23 7 - 25 mg/dL   Creat 7.82 9.56 - 2.13 mg/dL   Total Bilirubin 0.5 0.2 - 1.2 mg/dL   Alkaline Phosphatase 90 33 - 130 U/L   AST 14 10 - 35 U/L   ALT 14 6 - 29 U/L   Total Protein 7.0 6.1 - 8.1 g/dL   Albumin 4.4 3.6 - 5.1 g/dL   Calcium 9.3 8.6 - 08.6 mg/dL  Lipid panel  Result Value Ref Range   Cholesterol 142 125 - 200 mg/dL   Triglycerides 578 (H) <150 mg/dL   HDL 42 (L) >=46 mg/dL   Total  CHOL/HDL Ratio 3.4 <=5.0 Ratio   VLDL 35 (H) <30 mg/dL   LDL Cholesterol 65 <962 mg/dL  POCT glycosylated hemoglobin (Hb A1C)  Result Value Ref Range   Hemoglobin A1C 5.8

## 2014-11-19 ENCOUNTER — Other Ambulatory Visit: Payer: Self-pay | Admitting: Family Medicine

## 2015-09-30 ENCOUNTER — Encounter: Payer: Self-pay | Admitting: Family Medicine

## 2015-09-30 DIAGNOSIS — G5600 Carpal tunnel syndrome, unspecified upper limb: Secondary | ICD-10-CM | POA: Insufficient documentation

## 2015-10-08 ENCOUNTER — Ambulatory Visit (INDEPENDENT_AMBULATORY_CARE_PROVIDER_SITE_OTHER): Payer: BC Managed Care – PPO | Admitting: Family Medicine

## 2015-10-08 VITALS — BP 122/72 | HR 115 | Temp 97.6°F | Resp 17 | Ht 61.5 in | Wt 214.0 lb

## 2015-10-08 DIAGNOSIS — R000 Tachycardia, unspecified: Secondary | ICD-10-CM

## 2015-10-08 DIAGNOSIS — B370 Candidal stomatitis: Secondary | ICD-10-CM | POA: Diagnosis not present

## 2015-10-08 DIAGNOSIS — I10 Essential (primary) hypertension: Secondary | ICD-10-CM | POA: Diagnosis not present

## 2015-10-08 DIAGNOSIS — N3091 Cystitis, unspecified with hematuria: Secondary | ICD-10-CM

## 2015-10-08 DIAGNOSIS — E876 Hypokalemia: Secondary | ICD-10-CM | POA: Diagnosis not present

## 2015-10-08 DIAGNOSIS — J189 Pneumonia, unspecified organism: Secondary | ICD-10-CM

## 2015-10-08 DIAGNOSIS — R7303 Prediabetes: Secondary | ICD-10-CM

## 2015-10-08 DIAGNOSIS — E871 Hypo-osmolality and hyponatremia: Secondary | ICD-10-CM

## 2015-10-08 LAB — BASIC METABOLIC PANEL
BUN: 13 mg/dL (ref 7–25)
CO2: 27 mmol/L (ref 20–31)
Calcium: 9.4 mg/dL (ref 8.6–10.4)
Chloride: 99 mmol/L (ref 98–110)
Creat: 0.85 mg/dL (ref 0.50–0.99)
GLUCOSE: 108 mg/dL — AB (ref 65–99)
POTASSIUM: 3.3 mmol/L — AB (ref 3.5–5.3)
SODIUM: 140 mmol/L (ref 135–146)

## 2015-10-08 LAB — POCT CBC
GRANULOCYTE PERCENT: 72.7 % (ref 37–80)
HCT, POC: 38.1 % (ref 37.7–47.9)
Hemoglobin: 13.2 g/dL (ref 12.2–16.2)
Lymph, poc: 2.4 (ref 0.6–3.4)
MCH, POC: 27.4 pg (ref 27–31.2)
MCHC: 34.7 g/dL (ref 31.8–35.4)
MCV: 78.9 fL — AB (ref 80–97)
MID (cbc): 0.8 (ref 0–0.9)
MPV: 7.1 fL (ref 0–99.8)
PLATELET COUNT, POC: 393 10*3/uL (ref 142–424)
POC Granulocyte: 8.7 — AB (ref 2–6.9)
POC LYMPH %: 20.4 % (ref 10–50)
POC MID %: 6.9 %M (ref 0–12)
RBC: 4.83 M/uL (ref 4.04–5.48)
RDW, POC: 13.4 %
WBC: 11.9 10*3/uL — AB (ref 4.6–10.2)

## 2015-10-08 LAB — POCT SKIN KOH: Skin KOH, POC: NEGATIVE

## 2015-10-08 LAB — TSH: TSH: 0.64 m[IU]/L

## 2015-10-08 LAB — POCT GLYCOSYLATED HEMOGLOBIN (HGB A1C): HEMOGLOBIN A1C: 6.2

## 2015-10-08 MED ORDER — METOPROLOL SUCCINATE ER 100 MG PO TB24: 100.0000 mg | ORAL_TABLET | Freq: Every day | ORAL | 1 refills | Status: DC

## 2015-10-08 MED ORDER — CLOTRIMAZOLE 10 MG MT TROC: 10.0000 mg | Freq: Every day | OROMUCOSAL | 0 refills | Status: DC

## 2015-10-08 NOTE — Patient Instructions (Addendum)
Your infection fighting cells are slightly higher than when you were seen in the emergency room, but because your other symptoms are improving, we can remain on the same antibiotic for right now. If you have any worsening of symptoms, including any increased shortness of breath, fevers, or otherwise feel worse, return here or the emergency room as you may need to be on different antibiotic. Recheck with me on Tuesday September 5. Call tomorrow afternoon to schedule that appointment.  I will send a urine culture to make sure we are treating the right bacteria.  I refilled her metoprolol, but schedule appointment either with Dr. Dallas Sch[place] or new provider here for other medication refills and physical.  Although the test did not indicate pressure today, your exam does appear to be consistent with thrush. I prescribed the medicine to use 5 times per day. If this is worsening, return for recheck sooner. Thrush, Adult Kristen Scott, also called oral candidiasis, is a fungal infection that develops in the mouth and throat and on the tongue. It causes white patches to form on the mouth and tongue. Kristen Scott is most common in older adults, but it can occur at any age.  Many cases of thrush are mild, but this infection can also be more serious. Kristen Scott can be a recurring problem for people who have chronic illnesses or who take medicines that limit the body's ability to fight infection. Because these people have difficulty fighting infections, the fungus that causes thrush can spread throughout the body. This can cause life-threatening blood or organ infections. CAUSES  Kristen Scott is usually caused by a yeast called Candida albicans. This fungus is normally present in small amounts in the mouth and on other mucous membranes. It usually causes no harm. However, when conditions are present that allow the fungus to grow uncontrolled, it invades surrounding tissues and becomes an infection. Less often, other Candida species can also  lead to thrush.  RISK FACTORS Kristen Scott is more likely to develop in the following people:  People with an impaired ability to fight infection (weakened immune system).   Older adults.   People with HIV.   People with diabetes.   People with dry mouth (xerostomia).   Pregnant women.   People with poor dental care, especially those who have false teeth.   People who use antibiotic medicines.  SIGNS AND SYMPTOMS  Kristen Scott can be a mild infection that causes no symptoms. If symptoms develop, they may include:   A burning feeling in the mouth and throat. This can occur at the start of a thrush infection.   White patches that adhere to the mouth and tongue. The tissue around the patches may be red, raw, and painful. If rubbed (during tooth brushing, for example), the patches and the tissue of the mouth may bleed easily.   A bad taste in the mouth or difficulty tasting foods.   Cottony feeling in the mouth.   Pain during eating and swallowing. DIAGNOSIS  Your health care provider can usually diagnose thrush by looking in your mouth and asking you questions about your health.  TREATMENT  Medicines that help prevent the growth of fungi (antifungals) are the standard treatment for thrush. These medicines are either applied directly to the affected area (topical) or swallowed (oral). The treatment will depend on the severity of the condition.  Mild Thrush Mild cases of thrush may clear up with the use of an antifungal mouth rinse or lozenges. Treatment usually lasts about 14 days.  Moderate to Severe  Thrush  More severe thrush infections that have spread to the esophagus are treated with an oral antifungal medicine. A topical antifungal medicine may also be used.   For some severe infections, a treatment period longer than 14 days may be needed.   Oral antifungal medicines are almost never used during pregnancy because the fetus may be harmed. However, if a pregnant woman  has a rare, severe thrush infection that has spread to her blood, oral antifungal medicines may be used. In this case, the risk of harm to the mother and fetus from the severe thrush infection may be greater than the risk posed by the use of antifungal medicines.  Persistent or Recurrent Thrush For cases of thrush that do not go away or keep coming back, treatment may involve the following:   Treatment may be needed twice as long as the symptoms last.   Treatment will include both oral and topical antifungal medicines.   People with weakened immune systems can take an antifungal medicine on a continuous basis to prevent thrush infections.  It is important to treat conditions that make you more likely to get thrush, such as diabetes or HIV.  HOME CARE INSTRUCTIONS   Only take over-the-counter or prescription medicine as directed by your health care provider. Talk to your health care provider about an over-the-counter medicine called gentian violet, which kills bacteria and fungi.   Eat plain, unflavored yogurt as directed by your health care provider. Check the label to make sure the yogurt contains live cultures. This yogurt can help healthy bacteria grow in the mouth that can stop the growth of the fungus that causes thrush.   Try these measures to help reduce the discomfort of thrush:   Drink cold liquids such as water or iced tea.   Try flavored ice treats or frozen juices.   Eat foods that are easy to swallow, such as gelatin, ice cream, or custard.   If the patches in your mouth are painful, try drinking from a straw.   Rinse your mouth several times a day with a warm saltwater rinse. You can make the saltwater mixture with 1 tsp (6 g) of salt in 8 fl oz (0.2 L) of warm water.   If you wear dentures, remove the dentures before going to bed, brush them vigorously, and soak them in a cleaning solution as directed by your health care provider.   Women who are  breastfeeding should clean their nipples with an antifungal medicine as directed by their health care provider. Dry the nipples after breastfeeding. Applying lanolin-containing body lotion may help relieve nipple soreness.  SEEK MEDICAL CARE IF:  Your symptoms are getting worse or are not improving within 7 days of starting treatment.   You have symptoms of spreading infection, such as white patches on the skin outside of the mouth.   You are nursing and you have redness, burning, or pain in the nipples that is not relieved with treatment.  MAKE SURE YOU:  Understand these instructions.  Will watch your condition.  Will get help right away if you are not doing well or get worse.   This information is not intended to replace advice given to you by your health care provider. Make sure you discuss any questions you have with your health care provider.   Document Released: 10/19/2003 Document Revised: 02/13/2014 Document Reviewed: 08/26/2012 Elsevier Interactive Patient Education 2016 Elsevier Inc[employer]   Community-Acquired Pneumonia, Adult Pneumonia is an infection of the lungs. There are  different types of pneumonia. One type can develop while a person is in a hospital. A different type, called community-acquired pneumonia, develops in people who are not, or have not recently been, in the hospital or other health care facility.  CAUSES Pneumonia may be caused by bacteria, viruses, or funguses. Community-acquired pneumonia is often caused by Streptococcus pneumonia bacteria. These bacteria are often passed from one person to another by breathing in droplets from the cough or sneeze of an infected person. RISK FACTORS The condition is more likely to develop in:  People who havechronic diseases, such as chronic obstructive pulmonary disease (COPD), asthma, congestive heart failure, cystic fibrosis, diabetes, or kidney disease.  People who haveearly-stage or late-stage HIV.  People who  havesickle cell disease.  People who havehad their spleen removed (splenectomy).  People who havepoor Administrator.  People who havemedical conditions that increase the risk of breathing in (aspirating) secretions their own mouth and nose.   People who havea weakened immune system (immunocompromised).  People who smoke.  People whotravel to areas where pneumonia-causing germs commonly exist.  People whoare around animal habitats or animals that have pneumonia-causing germs, including birds, bats, rabbits, cats, and farm animals. SYMPTOMS Symptoms of this condition include:  Adry cough.  A wet (productive) cough.  Fever.  Sweating.  Chest pain, especially when breathing deeply or coughing.  Rapid breathing or difficulty breathing.  Shortness of breath.  Shaking chills.  Fatigue.  Muscle aches. DIAGNOSIS Your health care provider will take a medical history and perform a physical exam. You may also have other tests, including:  Imaging studies of your chest, including X-rays.  Tests to check your blood oxygen level and other blood gases.  Other tests on blood, mucus (sputum), fluid around your lungs (pleural fluid), and urine. If your pneumonia is severe, other tests may be done to identify the specific cause of your illness. TREATMENT The type of treatment that you receive depends on many factors, such as the cause of your pneumonia, the medicines you take, and other medical conditions that you have. For most adults, treatment and recovery from pneumonia may occur at home. In some cases, treatment must happen in a hospital. Treatment may include:  Antibiotic medicines, if the pneumonia was caused by bacteria.  Antiviral medicines, if the pneumonia was caused by a virus.  Medicines that are given by mouth or through an IV tube.  Oxygen.  Respiratory therapy. Although rare, treating severe pneumonia may include:  Mechanical ventilation. This is done  if you are not breathing well on your own and you cannot maintain a safe blood oxygen level.  Thoracentesis. This procedureremoves fluid around one lung or both lungs to help you breathe better. HOME CARE INSTRUCTIONS  Take over-the-counter and prescription medicines only as told by your health care provider.  Only takecough medicine if you are losing sleep. Understand that cough medicine can prevent your body's natural ability to remove mucus from your lungs.  If you were prescribed an antibiotic medicine, take it as told by your health care provider. Do not stop taking the antibiotic even if you start to feel better.  Sleep in a semi-upright position at night. Try sleeping in a reclining chair, or place a few pillows under your head.  Do not use tobacco products, including cigarettes, chewing tobacco, and e-cigarettes. If you need help quitting, ask your health care provider.  Drink enough water to keep your urine clear or pale yellow. This will help to thin out mucus  secretions in your lungs. PREVENTION There are ways that you can decrease your risk of developing community-acquired pneumonia. Consider getting a pneumococcal vaccine if:  You are older than 62 years of age.  You are older than 62 years of age and are undergoing cancer treatment, have chronic lung disease, or have other medical conditions that affect your immune system. Ask your health care provider if this applies to you. There are different types and schedules of pneumococcal vaccines. Ask your health care provider which vaccination option is best for you. You may also prevent community-acquired pneumonia if you take these actions:  Get an influenza vaccine every year. Ask your health care provider which type of influenza vaccine is best for you.  Go to the dentist on a regular basis.  Wash your hands often. Use hand sanitizer if soap and water are not available. SEEK MEDICAL CARE IF:  You have a fever.  You are  losing sleep because you cannot control your cough with cough medicine. SEEK IMMEDIATE MEDICAL CARE IF:  You have worsening shortness of breath.  You have increased chest pain.  Your sickness becomes worse, especially if you are an older adult or have a weakened immune system.  You cough up blood.   This information is not intended to replace advice given to you by your health care provider. Make sure you discuss any questions you have with your health care provider.   Document Released: 01/23/2005 Document Revised: 10/14/2014 Document Reviewed: 05/20/2014 Elsevier Interactive Patient Education 2016 Elsevier Inc.   Urinary Tract Infection Urinary tract infections (UTIs) can develop anywhere along your urinary tract. Your urinary tract is your body's drainage system for removing wastes and extra water. Your urinary tract includes two kidneys, two ureters, a bladder, and a urethra. Your kidneys are a pair of bean-shaped organs. Each kidney is about the size of your fist. They are located below your ribs, one on each side of your spine. CAUSES Infections are caused by microbes, which are microscopic organisms, including fungi, viruses, and bacteria. These organisms are so small that they can only be seen through a microscope. Bacteria are the microbes that most commonly cause UTIs. SYMPTOMS  Symptoms of UTIs may vary by age and gender of the patient and by the location of the infection. Symptoms in young women typically include a frequent and intense urge to urinate and a painful, burning feeling in the bladder or urethra during urination. Older women and men are more likely to be tired, shaky, and weak and have muscle aches and abdominal pain. A fever may mean the infection is in your kidneys. Other symptoms of a kidney infection include pain in your back or sides below the ribs, nausea, and vomiting. DIAGNOSIS To diagnose a UTI, your caregiver will ask you about your symptoms. Your caregiver  will also ask you to provide a urine sample. The urine sample will be tested for bacteria and white blood cells. White blood cells are made by your body to help fight infection. TREATMENT  Typically, UTIs can be treated with medication. Because most UTIs are caused by a bacterial infection, they usually can be treated with the use of antibiotics. The choice of antibiotic and length of treatment depend on your symptoms and the type of bacteria causing your infection. HOME CARE INSTRUCTIONS  If you were prescribed antibiotics, take them exactly as your caregiver instructs you. Finish the medication even if you feel better after you have only taken some of the medication.  Drink enough water and fluids to keep your urine clear or pale yellow.  Avoid caffeine, tea, and carbonated beverages. They tend to irritate your bladder.  Empty your bladder often. Avoid holding urine for long periods of time.  Empty your bladder before and after sexual intercourse.  After a bowel movement, women should cleanse from front to back. Use each tissue only once. SEEK MEDICAL CARE IF:   You have back pain.  You develop a fever.  Your symptoms do not begin to resolve within 3 days. SEEK IMMEDIATE MEDICAL CARE IF:   You have severe back pain or lower abdominal pain.  You develop chills.  You have nausea or vomiting.  You have continued burning or discomfort with urination. MAKE SURE YOU:   Understand these instructions.  Will watch your condition.  Will get help right away if you are not doing well or get worse.   This information is not intended to replace advice given to you by your health care provider. Make sure you discuss any questions you have with your health care provider.   Document Released: 11/02/2004 Document Revised: 10/14/2014 Document Reviewed: 03/03/2011 Elsevier Interactive Patient Education 2016 [employer].     IF you received an x-ray today, you will receive an invoice  from [facility]. Please contact [facility] at [phone] with questions or concerns regarding your invoice.   IF you received labwork today, you will receive an invoice from [employer]. Please contact [facility] at [phone] with questions or concerns regarding your invoice.   Our billing staff will not be able to assist you with questions regarding bills from these companies.  You will be contacted with the lab results as soon as they are available. The fastest way to get your results is to activate your My Chart account. Instructions are located on the last page of this paperwork. If you have not heard from [place] regarding the results in 2 weeks, please contact this office.

## 2015-10-08 NOTE — Progress Notes (Signed)
Subjective:  By signing my name below, I, Stann Ore, attest that this documentation has been prepared under the direction and in the presence of Meredith Staggers, MD. Electronically Signed: Stann Ore, Scribe. 10/08/2015 , 10:52 AM .  Patient was seen in Room 12 .   Patient ID: Kristen Scott, female    DOB: [birth year], 60 y.o.   MRN: [id number] Chief Complaint  Patient presents with  . Follow-up    thrush  . Pneumonia   HPI Kristen Scott is a 60 y.o. female Here for follow up of pneumonia. She was seen for UTI and community acquired pneumonia on Aug 28th through [facility]. She was started on augmentin 10 day course. Based on that note, she had been experiencing fever tmax 102, cough and myalgia for 3 days with green sputum. Her O2 stat was 94% in the ER. She had a normal WBC but left shift and hemoglobin of 12.0. Her chest xray showed left lower lobe and inferior segment left upper lobe infiltrate. She had no positive curb 65 criteria so treated outpatient; although rocephin was given in the ED.   Patient mentions she had a flu shot at [facility]. She's currently on day 5 today of her augmentin. She's slowly feeling improvement but still coughing up green sputum. Her fever has improved feeling normal yesterday and today. She's been able to keep fluids down. She's been mostly soup. She was taking mucinex previously. She works in a day care. She believes some children had strep or pneumonia.   Thrush She's also complaining of thrush. She was eating "Icies" and she felt a coating over her tongue. She denies any pain or burning on her tongue.   UTI For her UTI, she had urinalysis with 20-35 WBC and 10-20 RBC. Urine culture has 50k colonies of mixed flora. She states her UTI has improved.   Abnormal electrolytes Of note, her sodium was low at 131 and potassium was 3.3. She also had elevated glucose of 134.   Pre-Diabetes Lab Results  Component Value Date   HGBA1C 5.8 10/05/2014    HTN She takes metoprolol for HTN. She has 5 days worth left.   Patient Active Problem List   Diagnosis Date Noted  . Carpal tunnel syndrome 09/30/2015  . Pre-diabetes 10/05/2014  . GERD (gastroesophageal reflux disease) 03/05/2014  . Vitamin D deficiency 08/20/2011  . Hyperlipidemia 08/20/2011  . HTN (hypertension) 08/20/2011  . Obesity, Class II, BMI 35-39.9 08/20/2011  . Chronic pain disorder 08/20/2011  . Depression 08/20/2011  . DJD (degenerative joint disease) of knee 08/20/2011  . Arthritis, low back 03/30/2011  . Arthritis of neck (HCC) 03/30/2011   Past Medical History:  Diagnosis Date  . Arthritis   . Depression   . DJD (degenerative joint disease)    left knee  . GERD (gastroesophageal reflux disease)   . Hearing loss, sensorineural   . HTN (hypertension)   . Hyperlipidemia   . Migraine   . Osteopenia    Past Surgical History:  Procedure Laterality Date  . JOINT REPLACEMENT  2008   left knee   Allergies  Allergen Reactions  . Relafen [Nabumetone] Rash   Prior to Admission medications   Medication Sig Start Date End Date Taking? Authorizing Provider  atorvastatin (LIPITOR) 40 MG tablet TAKE 1 TABLET BY MOUTH ONCE DAILY 11/23/14  Yes Jessica C Copland, MD  DULoxetine (CYMBALTA) 30 MG capsule Take 90 mg by mouth daily. 08/17/11  Yes Maurice March, MD  HYDROcodone-acetaminophen [facility]  10-325 MG per tablet Take 1 tablet by mouth 2 (two) times daily.   Yes Historical Provider, MD  metoprolol succinate (TOPROL-XL) 100 MG 24 hr tablet Take 1 tablet (100 mg total) by mouth daily. 10/05/14  Yes Gwenlyn Found Copland, MD  omeprazole (PRILOSEC) 20 MG capsule Take 20 mg by mouth daily.   Yes Historical Provider, MD   Social History   Social History  . Marital status: Single    Spouse name: n/a  . Number of children: 2  . Years of education: N/A   Occupational History  . 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  [employer]    Social  History Main Topics  . Smoking status: Never Smoker  . Smokeless tobacco: Never Used  . Alcohol use No  . Drug use: No  . Sexual activity: Not on file   Other Topics Concern  . Not on file   Social History Narrative   Divorced 03/2012.   Lives alone. Her children live in [place], [place] and [place], [place].   Review of Systems  Constitutional: Positive for appetite change. Negative for chills, diaphoresis, fatigue and fever.  HENT: Positive for congestion.   Respiratory: Positive for cough. Negative for shortness of breath and wheezing.   Gastrointestinal: Negative for diarrhea, nausea and vomiting.  Genitourinary: Negative for dysuria, frequency and urgency.  Musculoskeletal: Positive for myalgias.       Objective:   Physical Exam  Constitutional: She is oriented to person, place, and time. She appears well-developed and well-nourished. No distress.  HENT:  Head: Normocephalic and atraumatic.  Right Ear: Hearing, tympanic membrane, external ear and ear canal normal.  Left Ear: Hearing, tympanic membrane, external ear and ear canal normal.  Nose: Nose normal.  Mouth/Throat: Oropharynx is clear and moist. No oropharyngeal exudate.  White patches coating tongue, nothing on throat or buccal mucosa  Eyes: Conjunctivae and EOM are normal. Pupils are equal, round, and reactive to light.  Neck: Carotid bruit is not present.  Cardiovascular: Regular rhythm, normal heart sounds and intact distal pulses.  Tachycardia present.   No murmur heard. Pulmonary/Chest: Effort normal. No respiratory distress. She has no wheezes. She has rhonchi (few) in the left upper field and the left lower field.  Abdominal: Soft. She exhibits no pulsatile midline mass. There is no tenderness.  Neurological: She is alert and oriented to person, place, and time.  Skin: Skin is warm and dry. No rash noted.  Psychiatric: She has a normal mood and affect. Her behavior is normal.  Vitals reviewed.   Vitals:     10/08/15 0959  BP: 122/72  Pulse: (!) 115  Resp: 17  Temp: 97.6 F (36.4 C)  TempSrc: Oral  SpO2: 95%  Weight: 214 lb (97.1 kg)  Height: 5' 1.5" (1.562 m)   Results for orders placed or performed in visit on 10/08/15  POCT CBC  Result Value Ref Range   WBC 11.9 (A) 4.6 - 10.2 K/uL   Lymph, poc 2.4 0.6 - 3.4   POC LYMPH PERCENT 20.4 10 - 50 %L   MID (cbc) 0.8 0 - 0.9   POC MID % 6.9 0 - 12 %M   POC Granulocyte 8.7 (A) 2 - 6.9   Granulocyte percent 72.7 37 - 80 %G   RBC 4.83 4.04 - 5.48 M/uL   Hemoglobin 13.2 12.2 - 16.2 g/dL   HCT, POC 16.1 09.6 - 47.9 %   MCV 78.9 (A) 80 -  97 fL   MCH, POC 27.4 27 - 31.2 pg   MCHC 34.7 31.8 - 35.4 g/dL   RDW, POC 40.9 %   Platelet Count, POC 393 142 - 424 K/uL   MPV 7.1 0 - 99.8 fL  POCT glycosylated hemoglobin (Hb A1C)  Result Value Ref Range   Hemoglobin A1C 6.2   POCT Skin KOH  Result Value Ref Range   Skin KOH, POC Negative        Assessment & Plan:    Kristen Scott is a 60 y.o. female CAP (community acquired pneumonia) - Plan: POCT CBC  - Improving. Discussed possible change to quinolone if not improving given exposure to daycare and possible DRSP risk. However as improved today, will continue Augmentin. Symptomatic care and RTC precautions.   Essential hypertension - Plan: metoprolol succinate (TOPROL-XL) 100 MG 24 hr tablet  -Stable. Continue metoprolol, labs pending. Return for establishing with new primary care provider or follow-up with Dr. Patsy Lager.  Cystitis with hematuria - Plan: POCT CBC, Urine culture, CANCELED: POCT urinalysis dipstick, CANCELED: POCT Microscopic Urinalysis (UMFC)  -Stable/improving. Repeat urine culture. Again, as above, consider quinolone if not improving.  Tachycardia - Plan: TSH  Hyponatremia - Plan: Basic metabolic panel Hypokalemia - Plan: Basic metabolic panel  -Noted on prior blood work. Repeat BMP.  Prediabetes - Plan: POCT glycosylated hemoglobin (Hb A1C)  Thrush, oral - Plan:  POCT Skin KOH, clotrimazole (MYCELEX) 10 MG troche  -Clinically appears to be thrush. Mycelex stretches given, discussed may need repeat dosing after completion of antibiotic.   Recheck Tuesday, September 5. Meds ordered this encounter  Medications  . metoprolol succinate (TOPROL-XL) 100 MG 24 hr tablet    Sig: Take 1 tablet (100 mg total) by mouth daily.    Dispense:  90 tablet    Refill:  1  . clotrimazole (MYCELEX) 10 MG troche    Sig: Take 1 tablet (10 mg total) by mouth 5 (five) times daily.    Dispense:  70 tablet    Refill:  0   Patient Instructions   Your infection fighting cells are slightly higher than when you were seen in the emergency room, but because your other symptoms are improving, we can remain on the same antibiotic for right now. If you have any worsening of symptoms, including any increased shortness of breath, fevers, or otherwise feel worse, return here or the emergency room as you may need to be on different antibiotic. Recheck with me on Tuesday September 5. Call tomorrow afternoon to schedule that appointment.  I will send a urine culture to make sure we are treating the right bacteria.  I refilled her metoprolol, but schedule appointment either with Dr. Dallas Schimke or new provider here for other medication refills and physical.  Although the test did not indicate pressure today, your exam does appear to be consistent with thrush. I prescribed the medicine to use 5 times per day. If this is worsening, return for recheck sooner. Thrush, Adult Ginette Pitman, also called oral candidiasis, is a fungal infection that develops in the mouth and throat and on the tongue. It causes white patches to form on the mouth and tongue. Ginette Pitman is most common in older adults, but it can occur at any age.  Many cases of thrush are mild, but this infection can also be more serious. Ginette Pitman can be a recurring problem for people who have chronic illnesses or who take medicines that limit the  body's ability to fight infection. Because these people  have difficulty fighting infections, the fungus that causes thrush can spread throughout the body. This can cause life-threatening blood or organ infections. CAUSES  Ginette Pitman is usually caused by a yeast called Candida albicans. This fungus is normally present in small amounts in the mouth and on other mucous membranes. It usually causes no harm. However, when conditions are present that allow the fungus to grow uncontrolled, it invades surrounding tissues and becomes an infection. Less often, other Candida species can also lead to thrush.  RISK FACTORS Ginette Pitman is more likely to develop in the following people:  People with an impaired ability to fight infection (weakened immune system).   Older adults.   People with HIV.   People with diabetes.   People with dry mouth (xerostomia).   Pregnant women.   People with poor dental care, especially those who have false teeth.   People who use antibiotic medicines.  SIGNS AND SYMPTOMS  Ginette Pitman can be a mild infection that causes no symptoms. If symptoms develop, they may include:   A burning feeling in the mouth and throat. This can occur at the start of a thrush infection.   White patches that adhere to the mouth and tongue. The tissue around the patches may be red, raw, and painful. If rubbed (during tooth brushing, for example), the patches and the tissue of the mouth may bleed easily.   A bad taste in the mouth or difficulty tasting foods.   Cottony feeling in the mouth.   Pain during eating and swallowing. DIAGNOSIS  Your health care provider can usually diagnose thrush by looking in your mouth and asking you questions about your health.  TREATMENT  Medicines that help prevent the growth of fungi (antifungals) are the standard treatment for thrush. These medicines are either applied directly to the affected area (topical) or swallowed (oral). The treatment will depend  on the severity of the condition.  Mild Thrush Mild cases of thrush may clear up with the use of an antifungal mouth rinse or lozenges. Treatment usually lasts about 14 days.  Moderate to Severe Thrush  More severe thrush infections that have spread to the esophagus are treated with an oral antifungal medicine. A topical antifungal medicine may also be used.   For some severe infections, a treatment period longer than 14 days may be needed.   Oral antifungal medicines are almost never used during pregnancy because the fetus may be harmed. However, if a pregnant woman has a rare, severe thrush infection that has spread to her blood, oral antifungal medicines may be used. In this case, the risk of harm to the mother and fetus from the severe thrush infection may be greater than the risk posed by the use of antifungal medicines.  Persistent or Recurrent Thrush For cases of thrush that do not go away or keep coming back, treatment may involve the following:   Treatment may be needed twice as long as the symptoms last.   Treatment will include both oral and topical antifungal medicines.   People with weakened immune systems can take an antifungal medicine on a continuous basis to prevent thrush infections.  It is important to treat conditions that make you more likely to get thrush, such as diabetes or HIV.  HOME CARE INSTRUCTIONS   Only take over-the-counter or prescription medicine as directed by your health care provider. Talk to your health care provider about an over-the-counter medicine called gentian violet, which kills bacteria and fungi.   Eat plain, unflavored yogurt  as directed by your health care provider. Check the label to make sure the yogurt contains live cultures. This yogurt can help healthy bacteria grow in the mouth that can stop the growth of the fungus that causes thrush.   Try these measures to help reduce the discomfort of thrush:   Drink cold liquids such as  water or iced tea.   Try flavored ice treats or frozen juices.   Eat foods that are easy to swallow, such as gelatin, ice cream, or custard.   If the patches in your mouth are painful, try drinking from a straw.   Rinse your mouth several times a day with a warm saltwater rinse. You can make the saltwater mixture with 1 tsp (6 g) of salt in 8 fl oz (0.2 L) of warm water.   If you wear dentures, remove the dentures before going to bed, brush them vigorously, and soak them in a cleaning solution as directed by your health care provider.   Women who are breastfeeding should clean their nipples with an antifungal medicine as directed by their health care provider. Dry the nipples after breastfeeding. Applying lanolin-containing body lotion may help relieve nipple soreness.  SEEK MEDICAL CARE IF:  Your symptoms are getting worse or are not improving within 7 days of starting treatment.   You have symptoms of spreading infection, such as white patches on the skin outside of the mouth.   You are nursing and you have redness, burning, or pain in the nipples that is not relieved with treatment.  MAKE SURE YOU:  Understand these instructions.  Will watch your condition.  Will get help right away if you are not doing well or get worse.   This information is not intended to replace advice given to you by your health care provider. Make sure you discuss any questions you have with your health care provider.   Document Released: 10/19/2003 Document Revised: 02/13/2014 Document Reviewed: 08/26/2012 Elsevier Interactive Patient Education 2016 Elsevier Inc.   Community-Acquired Pneumonia, Adult Pneumonia is an infection of the lungs. There are different types of pneumonia. One type can develop while a person is in a hospital. A different type, called community-acquired pneumonia, develops in people who are not, or have not recently been, in the hospital or other health care facility.   CAUSES Pneumonia may be caused by bacteria, viruses, or funguses. Community-acquired pneumonia is often caused by Streptococcus pneumonia bacteria. These bacteria are often passed from one person to another by breathing in droplets from the cough or sneeze of an infected person. RISK FACTORS The condition is more likely to develop in:  People who havechronic diseases, such as chronic obstructive pulmonary disease (COPD), asthma, congestive heart failure, cystic fibrosis, diabetes, or kidney disease.  People who haveearly-stage or late-stage HIV.  People who havesickle cell disease.  People who havehad their spleen removed (splenectomy).  People who havepoor Administrator.  People who havemedical conditions that increase the risk of breathing in (aspirating) secretions their own mouth and nose.   People who havea weakened immune system (immunocompromised).  People who smoke.  People whotravel to areas where pneumonia-causing germs commonly exist.  People whoare around animal habitats or animals that have pneumonia-causing germs, including birds, bats, rabbits, cats, and farm animals. SYMPTOMS Symptoms of this condition include:  Adry cough.  A wet (productive) cough.  Fever.  Sweating.  Chest pain, especially when breathing deeply or coughing.  Rapid breathing or difficulty breathing.  Shortness of breath.  Shaking  chills.  Fatigue.  Muscle aches. DIAGNOSIS Your health care provider will take a medical history and perform a physical exam. You may also have other tests, including:  Imaging studies of your chest, including X-rays.  Tests to check your blood oxygen level and other blood gases.  Other tests on blood, mucus (sputum), fluid around your lungs (pleural fluid), and urine. If your pneumonia is severe, other tests may be done to identify the specific cause of your illness. TREATMENT The type of treatment that you receive depends on many  factors, such as the cause of your pneumonia, the medicines you take, and other medical conditions that you have. For most adults, treatment and recovery from pneumonia may occur at home. In some cases, treatment must happen in a hospital. Treatment may include:  Antibiotic medicines, if the pneumonia was caused by bacteria.  Antiviral medicines, if the pneumonia was caused by a virus.  Medicines that are given by mouth or through an IV tube.  Oxygen.  Respiratory therapy. Although rare, treating severe pneumonia may include:  Mechanical ventilation. This is done if you are not breathing well on your own and you cannot maintain a safe blood oxygen level.  Thoracentesis. This procedureremoves fluid around one lung or both lungs to help you breathe better. HOME CARE INSTRUCTIONS  Take over-the-counter and prescription medicines only as told by your health care provider.  Only takecough medicine if you are losing sleep. Understand that cough medicine can prevent your body's natural ability to remove mucus from your lungs.  If you were prescribed an antibiotic medicine, take it as told by your health care provider. Do not stop taking the antibiotic even if you start to feel better.  Sleep in a semi-upright position at night. Try sleeping in a reclining chair, or place a few pillows under your head.  Do not use tobacco products, including cigarettes, chewing tobacco, and e-cigarettes. If you need help quitting, ask your health care provider.  Drink enough water to keep your urine clear or pale yellow. This will help to thin out mucus secretions in your lungs. PREVENTION There are ways that you can decrease your risk of developing community-acquired pneumonia. Consider getting a pneumococcal vaccine if:  You are older than 62 years of age.  You are older than 62 years of age and are undergoing cancer treatment, have chronic lung disease, or have other medical conditions that affect your  immune system. Ask your health care provider if this applies to you. There are different types and schedules of pneumococcal vaccines. Ask your health care provider which vaccination option is best for you. You may also prevent community-acquired pneumonia if you take these actions:  Get an influenza vaccine every year. Ask your health care provider which type of influenza vaccine is best for you.  Go to the dentist on a regular basis.  Wash your hands often. Use hand sanitizer if soap and water are not available. SEEK MEDICAL CARE IF:  You have a fever.  You are losing sleep because you cannot control your cough with cough medicine. SEEK IMMEDIATE MEDICAL CARE IF:  You have worsening shortness of breath.  You have increased chest pain.  Your sickness becomes worse, especially if you are an older adult or have a weakened immune system.  You cough up blood.   This information is not intended to replace advice given to you by your health care provider. Make sure you discuss any questions you have with your health care provider.  Document Released: 01/23/2005 Document Revised: 10/14/2014 Document Reviewed: 05/20/2014 Elsevier Interactive Patient Education 2016 Elsevier Inc.   Urinary Tract Infection Urinary tract infections (UTIs) can develop anywhere along your urinary tract. Your urinary tract is your body's drainage system for removing wastes and extra water. Your urinary tract includes two kidneys, two ureters, a bladder, and a urethra. Your kidneys are a pair of bean-shaped organs. Each kidney is about the size of your fist. They are located below your ribs, one on each side of your spine. CAUSES Infections are caused by microbes, which are microscopic organisms, including fungi, viruses, and bacteria. These organisms are so small that they can only be seen through a microscope. Bacteria are the microbes that most commonly cause UTIs. SYMPTOMS  Symptoms of UTIs may vary by age  and gender of the patient and by the location of the infection. Symptoms in young women typically include a frequent and intense urge to urinate and a painful, burning feeling in the bladder or urethra during urination. Older women and men are more likely to be tired, shaky, and weak and have muscle aches and abdominal pain. A fever may mean the infection is in your kidneys. Other symptoms of a kidney infection include pain in your back or sides below the ribs, nausea, and vomiting. DIAGNOSIS To diagnose a UTI, your caregiver will ask you about your symptoms. Your caregiver will also ask you to provide a urine sample. The urine sample will be tested for bacteria and white blood cells. White blood cells are made by your body to help fight infection. TREATMENT  Typically, UTIs can be treated with medication. Because most UTIs are caused by a bacterial infection, they usually can be treated with the use of antibiotics. The choice of antibiotic and length of treatment depend on your symptoms and the type of bacteria causing your infection. HOME CARE INSTRUCTIONS  If you were prescribed antibiotics, take them exactly as your caregiver instructs you. Finish the medication even if you feel better after you have only taken some of the medication.  Drink enough water and fluids to keep your urine clear or pale yellow.  Avoid caffeine, tea, and carbonated beverages. They tend to irritate your bladder.  Empty your bladder often. Avoid holding urine for long periods of time.  Empty your bladder before and after sexual intercourse.  After a bowel movement, women should cleanse from front to back. Use each tissue only once. SEEK MEDICAL CARE IF:   You have back pain.  You develop a fever.  Your symptoms do not begin to resolve within 3 days. SEEK IMMEDIATE MEDICAL CARE IF:   You have severe back pain or lower abdominal pain.  You develop chills.  You have nausea or vomiting.  You have continued  burning or discomfort with urination. MAKE SURE YOU:   Understand these instructions.  Will watch your condition.  Will get help right away if you are not doing well or get worse.   This information is not intended to replace advice given to you by your health care provider. Make sure you discuss any questions you have with your health care provider.   Document Released: 11/02/2004 Document Revised: 10/14/2014 Document Reviewed: 03/03/2011 Elsevier Interactive Patient Education 2016 [employer].     IF you received an x-ray today, you will receive an invoice from [facility]. Please contact [facility] at [phone] with questions or concerns regarding your invoice.   IF you received labwork today, you will receive an  invoice from [facility]. Please contact [facility] at [phone] with questions or concerns regarding your invoice.   Our billing staff will not be able to assist you with questions regarding bills from these companies.  You will be contacted with the lab results as soon as they are available. The fastest way to get your results is to activate your My Chart account. Instructions are located on the last page of this paperwork. If you have not heard from us regarding the results in 2 weeks, please contact this office.        Signed,   Meredith StaggersJeffrey Makaylynn Bonillas, MD [facility].  10/10/15 11:30 AM

## 2015-10-09 LAB — URINE CULTURE: Organism ID, Bacteria: NO GROWTH

## 2015-10-12 ENCOUNTER — Ambulatory Visit (INDEPENDENT_AMBULATORY_CARE_PROVIDER_SITE_OTHER): Payer: BC Managed Care – PPO | Admitting: Family Medicine

## 2015-10-12 VITALS — BP 120/76 | HR 88 | Temp 98.0°F | Resp 18 | Ht 61.5 in | Wt 216.0 lb

## 2015-10-12 DIAGNOSIS — B370 Candidal stomatitis: Secondary | ICD-10-CM | POA: Diagnosis not present

## 2015-10-12 DIAGNOSIS — R05 Cough: Secondary | ICD-10-CM | POA: Diagnosis not present

## 2015-10-12 DIAGNOSIS — J189 Pneumonia, unspecified organism: Secondary | ICD-10-CM | POA: Diagnosis not present

## 2015-10-12 DIAGNOSIS — R059 Cough, unspecified: Secondary | ICD-10-CM

## 2015-10-12 DIAGNOSIS — N3090 Cystitis, unspecified without hematuria: Secondary | ICD-10-CM | POA: Diagnosis not present

## 2015-10-12 MED ORDER — BENZONATATE 100 MG PO CAPS: 100.0000 mg | ORAL_CAPSULE | Freq: Three times a day (TID) | ORAL | 0 refills | Status: DC | PRN

## 2015-10-12 NOTE — Progress Notes (Signed)
By signing my name below I, Shelah Lewandowsky, attest that this documentation has been prepared under the direction and in the presence of Shade Flood, MD. Electonically Signed. Shelah Lewandowsky, Scribe 10/12/2015 at 11:37 AM  Subjective:    Patient ID: Kristen Scott, female    DOB: [birth year], 60 y.o.   MRN: [id number]  Chief Complaint  Patient presents with  . Follow-up    for pneumonia    HPI Kristen Scott is a 60 y.o. female who [facility] follow up reevaluation of pneumonia and UTI, see last visit. Pt states that she feels better today. Pt reports mild cough. Cough is occasionally productive with green phlegm. Pt denies fever, chills, malaise, SOB, abd pain, dysuria, urinary frequency, N/V. Pt reports having loose stool once a day since starting Augmentin. Cough has been keeping pt up at night.  Pt was seen and evaluated from reevaluation of pneumonia on 10/08/15. Pt was improving on Augmentin at that time. Pt also started on mycelex for thrush during last visit. Repeat urine culture on 10/08/15 showed no growth. Pt was afebrile with borderline CBC.  Lab Results  Component Value Date   WBC 11.9 (A) 10/08/2015   HGB 13.2 10/08/2015   HCT 38.1 10/08/2015   MCV 78.9 (A) 10/08/2015   PLT 280 10/05/2014     Patient Active Problem List   Diagnosis Date Noted  . Carpal tunnel syndrome 09/30/2015  . Pre-diabetes 10/05/2014  . GERD (gastroesophageal reflux disease) 03/05/2014  . Vitamin D deficiency 08/20/2011  . Hyperlipidemia 08/20/2011  . HTN (hypertension) 08/20/2011  . Obesity, Class II, BMI 35-39.9 08/20/2011  . Chronic pain disorder 08/20/2011  . Depression 08/20/2011  . DJD (degenerative joint disease) of knee 08/20/2011  . Arthritis, low back 03/30/2011  . Arthritis of neck (HCC) 03/30/2011   Past Medical History:  Diagnosis Date  . Arthritis   . Depression   . DJD (degenerative joint disease)    left knee  . GERD (gastroesophageal  reflux disease)   . Hearing loss, sensorineural   . HTN (hypertension)   . Hyperlipidemia   . Migraine   . Osteopenia    Past Surgical History:  Procedure Laterality Date  . JOINT REPLACEMENT  2008   left knee   Allergies  Allergen Reactions  . Relafen [Nabumetone] Rash   Prior to Admission medications   Medication Sig Start Date End Date Taking? Authorizing Provider  amoxicillin (AMOXIL) 875 MG tablet Take 875 mg by mouth 2 (two) times daily.   Yes Historical Provider, MD  atorvastatin (LIPITOR) 40 MG tablet TAKE 1 TABLET BY MOUTH ONCE DAILY 11/23/14  Yes Gwenlyn Found Copland, MD  clotrimazole (MYCELEX) 10 MG troche Take 1 tablet (10 mg total) by mouth 5 (five) times daily. 10/08/15  Yes Shade Flood, MD  DULoxetine (CYMBALTA) 30 MG capsule Take 90 mg by mouth daily. 08/17/11  Yes Maurice March, MD  HYDROcodone-acetaminophen [facility]) 10-325 MG per tablet Take 1 tablet by mouth 2 (two) times daily.   Yes Historical Provider, MD  metoprolol succinate (TOPROL-XL) 100 MG 24 hr tablet Take 1 tablet (100 mg total) by mouth daily. 10/08/15  Yes Shade Flood, MD  omeprazole (PRILOSEC) 20 MG capsule Take 20 mg by mouth daily.   Yes Historical Provider, MD   Social History   Social History  . Marital status: Single    Spouse name: n/a  . Number of children: 2  . Years of  education: N/A   Occupational History  . 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 retired teacher   .  [employer]    Social History Main Topics  . [employer], broadcasting/film/videong status: Never Smoker  . Smokeless tobacco: Never Used  . Alcohol use No  . Drug use: No  . Sexual activity: Not on file   Other Topics Concern  . Not on file   Social History Narrative   Divorced 03/2012.   Lives alone. [place], [place].      Review of Systems  Constitutional: Negative for fever.  Respiratory: Positive for cough. Negative for shortness of breath.   Gastrointestinal: Negative for abdominal  pain.  Genitourinary: Negative for dysuria and frequency.  Psychiatric/Behavioral: Positive for sleep disturbance (from cough).       Objective:   Physical Exam  Constitutional: She is oriented to person, place, and time. She appears well-developed and well-nourished. No distress.  HENT:  Head: Normocephalic and atraumatic.  Minimal white coating on tongue. None noted on buccal mucosa.  Eyes: Conjunctivae are normal. Pupils are equal, round, and reactive to light.  Neck: Neck supple.  Cardiovascular: Normal rate, regular rhythm and normal heart sounds.  Exam reveals no gallop and no friction rub.   No murmur heard. Pulmonary/Chest: Effort normal. No accessory muscle usage. No respiratory distress.  Pt has persistent coarse breath sounds bilat middle field.   Musculoskeletal: Normal range of motion.  Neurological: She is alert and oriented to person, place, and time.  Skin: Skin is warm and dry.  Psychiatric: She has a normal mood and affect. Her behavior is normal.  Nursing note and vitals reviewed.    Vitals:   10/12/15 1003  BP: 120/76  Pulse: 88  Resp: 18  Temp: 98 F (36.7 C)  TempSrc: Oral  SpO2: 95%  Weight: 216 lb (98 kg)  Height: 5' 1.5" (1.562 m)        Assessment & Plan:   Kristen Scott is a 60 y.o. female Cough - Plan: benzonatate (TESSALON) 100 MG capsule CAP (community acquired pneumonia)  - Overall appears to be improving on Augmentin. Still with some persistent cough, including at night. Continue Mucinex, but will add Tessalon Perles 3 times a day when necessary. RTC precautions, and plan on repeat chest x-ray in approximately 4 weeks.  Cystitis  -Improved. Urine culture from last visit reviewed with patient, no change in antibiotic at this time. If symptoms return after completion of antibiotic, return for recheck.  Thrush  -Symptomatically improved, continue Mycelex troche.  May need repeat dosing after completion of antibiotic.  Meds ordered this  encounter  Medications  . amoxicillin (AMOXIL) 875 MG tablet    Sig: Take 875 mg by mouth 2 (two) times daily.  . benzonatate (TESSALON) 100 MG capsule    Sig: Take 1 capsule (100 mg total) by mouth 3 (three) times daily as needed for cough.    Dispense:  20 capsule    Refill:  0   Patient Instructions    Your pneumonia appears to be improving on your current antibiotic. As long as your symptoms are improving, no changes are needed at this time. . After completion of the antibiotic, if any worsening of symptoms, return right away. Continue Mucinex as needed for cough, or the [employer]alon Perles that I prescribed today. Half to one of your hydrocodone at night if needed for cough.  Plan on repeat chest x-ray in 4 weeks to make  sure the previous area has cleared up.  Return to the clinic or go to the nearest emergency room if any of your symptoms worsen or new symptoms occur.  IF you received an x-ray today, you will receive an invoice from [facility]. Please contact [facility] at [phone] with questions or concerns regarding your invoice.   IF you received labwork today, you will receive an invoice from [employer]. Please contact [facility] at [phone] with questions or concerns regarding your invoice.   Our billing staff will not be able to assist you with questions regarding bills from these companies.  You will be contacted with the lab results as soon as they are available. The fastest way to get your results is to activate your My Chart account. Instructions are located on the last page of this paperwork. If you have not heard from [place] regarding the results in 2 weeks, please contact this office.        I personally performed the services described in this documentation, which was scribed in my presence. The recorded information has been reviewed and considered, and addended by me as needed.   Signed,   Meredith Staggers, MD [facility].  10/13/15 10:45 AM

## 2015-10-12 NOTE — Patient Instructions (Addendum)
  Your pneumonia appears to be improving on your current antibiotic. As long as your symptoms are improving, no changes are needed at this time. . After completion of the antibiotic, if any worsening of symptoms, return right away. Continue Mucinex as needed for cough, or the [employer]alon Perles that I prescribed today. Half to one of your hydrocodone at night if needed for cough.  Plan on repeat chest x-ray in 4 weeks to make sure the previous area has cleared up.  Return to the clinic or go to the nearest emergency room if any of your symptoms worsen or new symptoms occur.  IF you received an x-ray today, you will receive an invoice from [facility]. Please contact [facility] at [phone] with questions or concerns regarding your invoice.   IF you received labwork today, you will receive an invoice from [facility]. Please contact [facility] at [phone] with questions or concerns regarding your invoice.   Our billing staff will not be able to assist you with questions regarding bills from these companies.  You will be contacted with the lab results as soon as they are available. The fastest way to get your results is to activate your My Chart account. Instructions are located on the last page of this paperwork. If you have not heard from us regarding the results in 2 weeks, please contact this office.

## 2015-10-21 ENCOUNTER — Ambulatory Visit: Payer: BC Managed Care – PPO | Admitting: Family Medicine

## 2015-11-18 ENCOUNTER — Encounter: Payer: Self-pay | Admitting: Family Medicine

## 2015-11-18 ENCOUNTER — Ambulatory Visit (INDEPENDENT_AMBULATORY_CARE_PROVIDER_SITE_OTHER): Payer: BC Managed Care – PPO | Admitting: Family Medicine

## 2015-11-18 VITALS — BP 121/76 | HR 80 | Temp 98.1°F | Ht 61.0 in | Wt 222.0 lb

## 2015-11-18 DIAGNOSIS — E785 Hyperlipidemia, unspecified: Secondary | ICD-10-CM | POA: Diagnosis not present

## 2015-11-18 DIAGNOSIS — Z0000 Encounter for general adult medical examination without abnormal findings: Secondary | ICD-10-CM

## 2015-11-18 DIAGNOSIS — Z5181 Encounter for therapeutic drug level monitoring: Secondary | ICD-10-CM | POA: Diagnosis not present

## 2015-11-18 DIAGNOSIS — R7303 Prediabetes: Secondary | ICD-10-CM

## 2015-11-18 DIAGNOSIS — I10 Essential (primary) hypertension: Secondary | ICD-10-CM

## 2015-11-18 DIAGNOSIS — Z1329 Encounter for screening for other suspected endocrine disorder: Secondary | ICD-10-CM | POA: Diagnosis not present

## 2015-11-18 MED ORDER — ATORVASTATIN CALCIUM 40 MG PO TABS: ORAL_TABLET | ORAL | 3 refills | Status: DC

## 2015-11-18 NOTE — Progress Notes (Signed)
Pre visit review using our clinic review tool, if applicable. No additional management support is needed unless otherwise documented below in the visit note. 

## 2015-11-18 NOTE — Patient Instructions (Signed)
It was very nice to see you today- I'll be in touch with your labs Your BP looks fine today I refilled your lipitor so you can start taking it again  Continue to work on exercise and diet with a goal of losing a few pounds- this will help prevent diabetes!

## 2015-11-18 NOTE — Progress Notes (Addendum)
[facility] [address] [place], [place] [address] [phone]  Date:  11/18/2015   Name:  Kristen Scott   DOB:  1955/11/22   MRN:  [id number]  PCP:  Abbe Amsterdam, MD    Chief Complaint: Establish Care (Pt here to est care. Pt would like physical and blood work. )   History of Present Illness:  Kristen Scott is a 60 y.o. very pleasant female patient who presents with the following:  Here today seeking a CPE- [facility]. History of HTN, obesity, pre-diabetes, GERD, hyperlipidemia.    Last complete labs over a year ago She sees an OBG for her well woman care- [facility].  She will be seen on 10/31 for her pap. mammo this past February- normal  Colonoscopy is UTD, so are immunizations She had her annual flu shot already this year at [facility].  She did get a bit of a fever following this and then developed pneumonia.  She was seen in the ER 2 days later.  She feels like her sx are resolved and she is no longer coughing or having any fever  Chronic back pain treated by Dr. Ethelene Hal She still sees him on a regular basis She also has developed CTS and sees Dr. Ranell Patrick for this.    She is not fasting today but would like to go ahead and get her labs out of the way if possible She is getting a colonoscopy soon- she does a 5 year recall She is trying to get some exercise by walking on her treadmill  She has been out of lipitor for a couple of months- just ran out but would not mind starting back on this   Lab Results  Component Value Date   HGBA1C 6.2 10/08/2015     Patient Active Problem List   Diagnosis Date Noted  . Carpal tunnel syndrome 09/30/2015  . Pre-diabetes 10/05/2014  . GERD (gastroesophageal reflux disease) 03/05/2014  . Vitamin D deficiency 08/20/2011  . Hyperlipidemia 08/20/2011  . HTN (hypertension) 08/20/2011  . Obesity, Class II, BMI 35-39.9 08/20/2011  . Chronic pain  disorder 08/20/2011  . Depression 08/20/2011  . DJD (degenerative joint disease) of knee 08/20/2011  . Arthritis, low back 03/30/2011  . Arthritis of neck (HCC) 03/30/2011    Past Medical History:  Diagnosis Date  . Arthritis   . Depression   . DJD (degenerative joint disease)    left knee  . GERD (gastroesophageal reflux disease)   . Hearing loss, sensorineural   . HTN (hypertension)   . Hyperlipidemia   . Migraine   . Osteopenia     Past Surgical History:  Procedure Laterality Date  . JOINT REPLACEMENT  2008   left knee    Social History  Substance Use Topics  . Smoking status: Never Smoker  . Smokeless tobacco: Never Used  . Alcohol use No    Family History  Problem Relation Age of Onset  . Dementia Mother   . Diabetes Father   . Diabetes Brother   . Mental illness Daughter     anxiety, OCD  . Heart attack Maternal Grandfather   . Diabetes Sister   . Diabetes Brother     Allergies  Allergen Reactions  . Relafen [Nabumetone] Rash    Medication list has been reviewed and updated.  Current Outpatient Prescriptions on File Prior to Visit  Medication Sig Dispense Refill  . amoxicillin (  AMOXIL) 875 MG tablet Take 875 mg by mouth 2 (two) times daily.    Marland Kitchen. atorvastatin (LIPITOR) 40 MG tablet TAKE 1 TABLET BY MOUTH ONCE DAILY 90 tablet 0  . benzonatate (TESSALON) 100 MG capsule Take 1 capsule (100 mg total) by mouth 3 (three) times daily as needed for cough. 20 capsule 0  . clotrimazole (MYCELEX) 10 MG troche Take 1 tablet (10 mg total) by mouth 5 (five) times daily. 70 tablet 0  . DULoxetine (CYMBALTA) 30 MG capsule Take 90 mg by mouth daily.    Marland Kitchen. HYDROcodone-acetaminophen (NORCO) 10-325 MG per tablet Take 1 tablet by mouth 2 (two) times daily.    . metoprolol succinate (TOPROL-XL) 100 MG 24 hr tablet Take 1 tablet (100 mg total) by mouth daily. 90 tablet 1  . omeprazole (PRILOSEC) 20 MG capsule Take 20 mg by mouth daily.     No current facility-administered  medications on file prior to visit.     Review of Systems:  As per HPI- otherwise negative.   Physical Examination: Blood pressure 121/76, pulse 80, temperature 98.1 F (36.7 C), temperature source Oral, height 5\' 1"  (1.549 m), weight 222 lb (100.7 kg), last menstrual period 03/29/2006, SpO2 97 %.  Ideal Body Weight:    GEN: WDWN, NAD, Non-toxic, A & O x 3, obese, otherwise looks well HEENT: Atraumatic, Normocephalic. Neck supple. No masses, No LAD.  Bilateral TM wnl, oropharynx normal.  PEERL,EOMI.   Ears and Nose: No external deformity. CV: RRR, No M/G/R. No JVD. No thrill. No extra heart sounds. PULM: CTA B, no wheezes, crackles, rhonchi. No retractions. No resp. distress. No accessory muscle use. ABD: S, NT, ND. No rebound. No HSM. EXTR: No c/c/e NEURO Normal gait.  PSYCH: Normally interactive. Conversant. Not depressed or anxious appearing.  Calm demeanor.    Assessment and Plan: Physical exam  Screening for hypothyroidism - Plan: TSH  Essential hypertension - Plan: TSH  Hyperlipidemia, unspecified hyperlipidemia type - Plan: atorvastatin (LIPITOR) 40 MG tablet, Comprehensive metabolic panel, Lipid panel  Pre-diabetes - Plan: Comprehensive metabolic panel, Hemoglobin A1C  Medication monitoring encounter - Plan: Comprehensive metabolic panel, CBC  Here today for a CPE She has stopped her cholesterol meds- she likely does still need this. Will check her panel today but also refilled her lipitor She sees OBG, colonoscopy appt pending soon Encouraged weight loss, exercise Will plan further follow- up pending labs.  Signed Abbe AmsterdamJessica Buddy Loeffelholz, MD  [facility]  her CHL is high, will have her go back on her lipitor.  Otherwise labs look good, A1c is in pre-diabetes range still, improved from previous She does have mild anemia but colonoscopy is planned soon  Results for orders placed or performed in visit on 11/18/15  Comprehensive metabolic panel   Result Value Ref Range   Sodium 141 135 - 145 mEq/L   Potassium 4.3 3.5 - 5.1 mEq/L   Chloride 105 96 - 112 mEq/L   CO2 29 19 - 32 mEq/L   Glucose, Bld 98 70 - 99 mg/dL   BUN 17 6 - 23 mg/dL   Creatinine, Ser 1.610.80 0.40 - 1.20 mg/dL   Total Bilirubin 0.3 0.2 - 1.2 mg/dL   Alkaline Phosphatase 80 39 - 117 U/L   AST 23 0 - 37 U/L   ALT 19 0 - 35 U/L   Total Protein 7.0 6.0 - 8.3 g/dL   Albumin 4.1 3.5 - 5.2 g/dL   Calcium 9.0 8.4 - 09.610.5 mg/dL  GFR 77.19 >60.00 mL/min  CBC  Result Value Ref Range   WBC 6.2 4.0 - 10.5 K/uL   RBC 4.17 3.87 - 5.11 Mil/uL   Platelets 297.0 150.0 - 400.0 K/uL   Hemoglobin 11.5 (L) 12.0 - 15.0 g/dL   HCT 16.1 (L) 09.6 - 04.5 %   MCV 82.4 78.0 - 100.0 fl   MCHC 33.6 30.0 - 36.0 g/dL   RDW 40.9 81.1 - 91.4 %  TSH  Result Value Ref Range   TSH 1.81 0.35 - 4.50 uIU/mL  Lipid panel  Result Value Ref Range   Cholesterol 249 (H) 0 - 200 mg/dL   Triglycerides 782.9 (H) 0.0 - 149.0 mg/dL   HDL 56.21 (L) >30.86 mg/dL   VLDL 57.8 (H) 0.0 - 46.9 mg/dL   Total CHOL/HDL Ratio 6    NonHDL 210.14   Hemoglobin A1C  Result Value Ref Range   Hgb A1c MFr Bld 5.8 4.6 - 6.5 %  LDL cholesterol, direct  Result Value Ref Range   Direct LDL 155.0 mg/dL

## 2015-11-19 ENCOUNTER — Encounter: Payer: Self-pay | Admitting: Family Medicine

## 2015-11-19 LAB — LIPID PANEL
Cholesterol: 249 mg/dL — ABNORMAL HIGH (ref 0–200)
HDL: 39 mg/dL — AB (ref >39.00)
NonHDL: 210.14
TRIGLYCERIDES: 362 mg/dL — AB (ref 0.0–149.0)
Total CHOL/HDL Ratio: 6
VLDL: 72.4 mg/dL — AB (ref 0.0–40.0)

## 2015-11-19 LAB — COMPREHENSIVE METABOLIC PANEL
ALBUMIN: 4.1 g/dL (ref 3.5–5.2)
ALK PHOS: 80 U/L (ref 39–117)
ALT: 19 U/L (ref 0–35)
AST: 23 U/L (ref 0–37)
BUN: 17 mg/dL (ref 6–23)
CALCIUM: 9 mg/dL (ref 8.4–10.5)
CHLORIDE: 105 meq/L (ref 96–112)
CO2: 29 mEq/L (ref 19–32)
CREATININE: 0.8 mg/dL (ref 0.40–1.20)
GFR: 77.19 mL/min (ref >60.00)
Glucose, Bld: 98 mg/dL (ref 70–99)
POTASSIUM: 4.3 meq/L (ref 3.5–5.1)
Sodium: 141 mEq/L (ref 135–145)
TOTAL PROTEIN: 7 g/dL (ref 6.0–8.3)
Total Bilirubin: 0.3 mg/dL (ref 0.2–1.2)

## 2015-11-19 LAB — CBC
HEMATOCRIT: 34.3 % — AB (ref 36.0–46.0)
Hemoglobin: 11.5 g/dL — ABNORMAL LOW (ref 12.0–15.0)
MCHC: 33.6 g/dL (ref 30.0–36.0)
MCV: 82.4 fl (ref 78.0–100.0)
Platelets: 297 10*3/uL (ref 150.0–400.0)
RBC: 4.17 Mil/uL (ref 3.87–5.11)
RDW: 14.4 % (ref 11.5–15.5)
WBC: 6.2 10*3/uL (ref 4.0–10.5)

## 2015-11-19 LAB — HEMOGLOBIN A1C: HEMOGLOBIN A1C: 5.8 % (ref 4.6–6.5)

## 2015-11-19 LAB — TSH: TSH: 1.81 u[IU]/mL (ref 0.35–4.50)

## 2015-11-19 LAB — LDL CHOLESTEROL, DIRECT: Direct LDL: 155 mg/dL

## 2015-12-09 ENCOUNTER — Telehealth: Payer: Self-pay | Admitting: Family Medicine

## 2015-12-10 NOTE — Telephone Encounter (Signed)
error 

## 2015-12-23 ENCOUNTER — Ambulatory Visit (INDEPENDENT_AMBULATORY_CARE_PROVIDER_SITE_OTHER): Payer: BC Managed Care – PPO | Admitting: Family Medicine

## 2015-12-23 VITALS — BP 138/92 | HR 97 | Temp 98.0°F | Resp 16 | Ht 62.0 in | Wt 216.0 lb

## 2015-12-23 DIAGNOSIS — H6123 Impacted cerumen, bilateral: Secondary | ICD-10-CM

## 2015-12-23 DIAGNOSIS — Z01810 Encounter for preprocedural cardiovascular examination: Secondary | ICD-10-CM

## 2015-12-23 NOTE — Progress Notes (Signed)
[facility] [address], [address] [place], [place] [id number] [phone] [phone]  Date:  12/23/2015   Name:  Kristen NipJanice Scott   DOB:  22-Jun-1955   MRN:  [id number]  PCP:  Abbe AmsterdamOPLAND,Jackolyn Geron, MD    Chief Complaint: Pre-op Exam   History of Present Illness:  Kristen NipJanice Scott is a 60 y.o. very pleasant female patient who presents with the following:  Here today for a pre-op evaluation.  I saw her last month but did not do an EKG as we did not know that she planned to have surgery.   History of HTN, pre-diabetes, chronic back pain  Last labs done in October-  At that time she was not taking her lipitor but we had her restart this medication. A1c shows pre-diabetes   They plan to do her operation in January of this year.  They plan to to a CT release on the left per Dr. Ranell PatrickNorris.  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 She does walk a couple of miles twice a week for exercise.   No CP or SOB with her usual activity level.  No syncope.   She has never been dx with any heart problems  She has also noted that her ears feel like they are clogged up and popping a lot- she would like for us to remove any impacted ear wax that is present.  She is having a hearing eval next months  Office Visit on 11/18/2015  Component Date Value Ref Range Status  . Sodium 11/19/2015 141  135 - 145 mEq/L Final  . Potassium 11/19/2015 4.3  3.5 - 5.1 mEq/L Final  . Chloride 11/19/2015 105  96 - 112 mEq/L Final  . CO2 11/19/2015 29  19 - 32 mEq/L Final  . Glucose, Bld 11/19/2015 98  70 - 99 mg/dL Final  . BUN 29/52/841310/13/2017 17  6 - 23 mg/dL Final  . Creatinine, Ser 11/19/2015 0.80  0.40 - 1.20 mg/dL Final  . Total Bilirubin 11/19/2015 0.3  0.2 - 1.2 mg/dL Final  . Alkaline Phosphatase 11/19/2015 80  39 - 117 U/L Final  . AST 11/19/2015 23  0 - 37 U/L Final  . ALT 11/19/2015 19  0 - 35 U/L Final  .  Total Protein 11/19/2015 7.0  6.0 - 8.3 g/dL Final  . Albumin 24/40/102710/13/2017 4.1  3.5 - 5.2 g/dL Final  . Calcium 25/36/644010/13/2017 9.0  8.4 - 10.5 mg/dL Final  . GFR 34/74/259510/13/2017 77.19  >60.00 mL/min Final  . WBC 11/19/2015 6.2  4.0 - 10.5 K/uL Final  . RBC 11/19/2015 4.17  3.87 - 5.11 Mil/uL Final  . Platelets 11/19/2015 297.0  150.0 - 400.0 K/uL Final  . Hemoglobin 11/19/2015 11.5* 12.0 - 15.0 g/dL Final  . HCT 63/87/564310/13/2017 34.3* 36.0 - 46.0 % Final  . MCV 11/19/2015 82.4  78.0 - 100.0 fl Final  . MCHC 11/19/2015 33.6  30.0 - 36.0 g/dL Final  . RDW 32/95/188410/13/2017 14.4  11.5 - 15.5 % Final  . TSH 11/19/2015 1.81  0.35 - 4.50 uIU/mL Final  . Cholesterol 11/19/2015 249* 0 - 200 mg/dL Final  . Triglycerides 11/19/2015 362.0* 0.0 - 149.0 mg/dL Final  . HDL 16/60/630110/13/2017 39.00* >39.00 mg/dL Final  . VLDL 60/10/932310/13/2017 72.4* 0.0 - 40.0 mg/dL Final  . Total CHOL/HDL Ratio 11/19/2015 6   Final  . NonHDL  11/19/2015 210.14   Final  . Hgb A1c MFr Bld 11/19/2015 5.8  4.6 - 6.5 % Final  . Direct LDL 11/19/2015 155.0  mg/dL Final     Patient Active Problem List   Diagnosis Date Noted  . Carpal tunnel syndrome 09/30/2015  . Pre-diabetes 10/05/2014  . GERD (gastroesophageal reflux disease) 03/05/2014  . Vitamin D deficiency 08/20/2011  . Hyperlipidemia 08/20/2011  . HTN (hypertension) 08/20/2011  . Obesity, Class II, BMI 35-39.9 08/20/2011  . Chronic pain disorder 08/20/2011  . Depression 08/20/2011  . DJD (degenerative joint disease) of knee 08/20/2011  . Arthritis, low back 03/30/2011  . Arthritis of neck (HCC) 03/30/2011    Past Medical History:  Diagnosis Date  . Arthritis   . Depression   . DJD (degenerative joint disease)    left knee  . GERD (gastroesophageal reflux disease)   . Hearing loss, sensorineural   . HTN (hypertension)   . Hyperlipidemia   . Migraine   . Osteopenia     Past Surgical History:  Procedure Laterality Date  . JOINT REPLACEMENT  2008   left knee    Social History   Substance Use Topics  . Smoking status: Never Smoker  . Smokeless tobacco: Never Used  . Alcohol use No    Family History  Problem Relation Age of Onset  . Dementia Mother   . Diabetes Father   . Diabetes Brother   . Mental illness Daughter     anxiety, OCD  . Heart attack Maternal Grandfather   . Diabetes Sister   . Diabetes Brother     Allergies  Allergen Reactions  . Relafen [Nabumetone] Rash    Medication list has been reviewed and updated.  Current Outpatient Prescriptions on File Prior to Visit  Medication Sig Dispense Refill  . atorvastatin (LIPITOR) 40 MG tablet TAKE 1 TABLET BY MOUTH ONCE DAILY 90 tablet 3  . DULoxetine (CYMBALTA) 30 MG capsule Take 90 mg by mouth daily.    Marland Kitchen. HYDROcodone-acetaminophen (NORCO) 10-325 MG per tablet Take 1 tablet by mouth 2 (two) times daily.    . metoprolol succinate (TOPROL-XL) 100 MG 24 hr tablet Take 1 tablet (100 mg total) by mouth daily. 90 tablet 1  . omeprazole (PRILOSEC) 20 MG capsule Take 20 mg by mouth daily.     No current facility-administered medications on file prior to visit.     Review of Systems:  As per HPI- otherwise negative.   Physical Examination: Blood pressure (!) 152/94, pulse 97, temperature 98 F (36.7 C), temperature source Oral, resp. rate 16, height 5\' 2"  (1.575 m), weight 216 lb (98 kg), last menstrual period 03/29/2006, SpO2 96 %.  Ideal Body Weight:    GEN: WDWN, NAD, Non-toxic, A & O x 3, obese, looks well HEENT: Atraumatic, Normocephalic. Neck supple. No masses, No LAD.  Bilateral TM wnl, oropharynx normal.  PEERL,EOMI.   Cerumen in both ears was removed with irrigation and forceps- removed successfully and normal TM after procedure bilaterally  Ears and Nose: No external deformity. CV: RRR, No M/G/R. No JVD. No thrill. No extra heart sounds. PULM: CTA B, no wheezes, crackles, rhonchi. No retractions. No resp. distress. No accessory muscle use. EXTR: No c/c/e NEURO Normal gait.  PSYCH:  Normally interactive. Conversant. Not depressed or anxious appearing.  Calm demeanor.   EKG: sinus rhythm, normal EKG except for V5. Noted after pt off machine and dressed that V5 did not pick up.  However as the rest of her EKG  is normal and she is asymptomatic will not have her re-do tracing. Normal V5 on EKG from 2014  BP Readings from Last 3 Encounters:  12/23/15 (!) 152/94  11/18/15 121/76  10/12/15 120/76   Pt just took her BP meds prior to visit - repeat BP better Assessment and Plan: Pre-operative cardiovascular examination - Plan: EKG 12-Lead, CANCELED: EKG 12-Lead  Bilateral impacted cerumen  Plan for CT release in a few months. She has excellent exercise tolerance and no serious risk factors. Ok to proceed to operating room Removed cerumen from her ears bilaterally as above.,  She tolerated the procedure well  Signed Abbe Amsterdam, MD

## 2015-12-23 NOTE — Patient Instructions (Signed)
I hope that your surgery is easy and successful!  Your EKG looks fine today Please come and see me in the spring to check on your labs.  Take care and happy holidays!

## 2015-12-23 NOTE — Progress Notes (Signed)
Pre visit review using our clinic review tool, if applicable. No additional management support is needed unless otherwise documented below in the visit note. 

## 2016-04-07 ENCOUNTER — Other Ambulatory Visit: Payer: Self-pay | Admitting: Family Medicine

## 2016-04-07 DIAGNOSIS — I10 Essential (primary) hypertension: Secondary | ICD-10-CM

## 2016-05-22 ENCOUNTER — Other Ambulatory Visit: Payer: Self-pay | Admitting: Surgical Oncology

## 2016-05-22 DIAGNOSIS — R921 Mammographic calcification found on diagnostic imaging of breast: Secondary | ICD-10-CM

## 2016-05-25 ENCOUNTER — Other Ambulatory Visit: Payer: Self-pay | Admitting: Surgical Oncology

## 2016-05-25 ENCOUNTER — Ambulatory Visit
Admission: RE | Admit: 2016-05-25 | Discharge: 2016-05-25 | Disposition: A | Discharge status: Home / Self Care | Payer: BC Managed Care – PPO | Source: Ambulatory Visit | Attending: Surgical Oncology | Admitting: Surgical Oncology

## 2016-05-25 DIAGNOSIS — R921 Mammographic calcification found on diagnostic imaging of breast: Secondary | ICD-10-CM

## 2016-05-25 HISTORY — PX: BREAST BIOPSY: SHX20

## 2016-10-27 ENCOUNTER — Other Ambulatory Visit: Payer: Self-pay | Admitting: Surgical Oncology

## 2016-10-27 DIAGNOSIS — R920 Mammographic microcalcification found on diagnostic imaging of breast: Secondary | ICD-10-CM

## 2016-10-28 NOTE — Progress Notes (Addendum)
[facility] [address], [address] [place], [place] [address] [phone]  Date:  10/30/2016   Name:  Kristen Scott   DOB:  April 30, 1955   MRN:  [id number]  PCP:  Pearline Cables, MD    Chief Complaint: Follow-up (Pt here for f/u visit. )   History of Present Illness:  Kristen Scott is a 61 y.o. very pleasant female patient who presents with the following:  Here today for a follow-up visit History of obesity, chronic pain, depression, joint disease, pre-diabetes, hyperlipidemia I saw her last fall for a pre-operative evaluation prior to a carpal tunnel release procedure She enjoys playing the piano and was hoping to get her hand function back up to par  Last full labs about a year ago Pap: per her OBG, Dr. Primitivo Gauze Flu shot due today  She is taking lipitor, cymbalta, toprol., prilosec She is fasting today for labs  Her carpal tunnel release went very well- she had the left only and is pleased with the results  After her flu shot last year she got pneumonia but we think this is unrelated as her sx started within hours  No rash, swelling or other signs of allergic reaction.  She would like to try a flu shot again  She does take prilosec daily- she has GERD, but never had an ulcer.  She does see GI annually- will ask them if she needs to continue to take prilosec at next follo-up visit  She does take hydrocodone on occasion for joint pain  Her daughter just had a healthy baby boy- her son also has a daughter who is nearly 2.  She is thrilled with her grandchildren   Lab Results  Component Value Date   HGBA1C 6.0 10/30/2016   BP Readings from Last 3 Encounters:  10/30/16 125/67  12/23/15 (!) 138/92  11/18/15 121/76     Patient Active Problem List   Diagnosis Date Noted  . Carpal tunnel syndrome 09/30/2015  . Pre-diabetes 10/05/2014  . GERD (gastroesophageal reflux disease) 03/05/2014  . Vitamin D deficiency  08/20/2011  . Hyperlipidemia 08/20/2011  . HTN (hypertension) 08/20/2011  . Obesity, Class II, BMI 35-39.9 08/20/2011  . Chronic pain disorder 08/20/2011  . Depression 08/20/2011  . DJD (degenerative joint disease) of knee 08/20/2011  . Arthritis, low back (HCC) 03/30/2011  . Arthritis of neck (HCC) 03/30/2011    Past Medical History:  Diagnosis Date  . Arthritis   . Depression   . DJD (degenerative joint disease)    left knee  . GERD (gastroesophageal reflux disease)   . Hearing loss, sensorineural   . HTN (hypertension)   . Hyperlipidemia   . Migraine   . Osteopenia     Past Surgical History:  Procedure Laterality Date  . JOINT REPLACEMENT  2008   left knee    Social History  Substance Use Topics  . Smoking status: Never Smoker  . Smokeless tobacco: Never Used  . Alcohol use No    Family History  Problem Relation Age of Onset  . Dementia Mother   . Diabetes Father   . Diabetes Brother   . Mental illness Daughter        anxiety, OCD  . Heart attack Maternal Grandfather   . Diabetes Sister   . Diabetes Brother     Allergies  Allergen Reactions  . Relafen [Nabumetone] Rash    Medication list has been reviewed and updated.  Current  Outpatient Prescriptions on File Prior to Visit  Medication Sig Dispense Refill  . DULoxetine (CYMBALTA) 30 MG capsule Take 90 mg by mouth daily.    Marland Kitchen HYDROcodone-acetaminophen (NORCO) 10-325 MG per tablet Take 1 tablet by mouth 2 (two) times daily.    . metoprolol succinate (TOPROL-XL) 100 MG 24 hr tablet take 1 tablet by mouth daily 90 tablet 3  . omeprazole (PRILOSEC) 20 MG capsule Take 20 mg by mouth daily.     No current facility-administered medications on file prior to visit.     Review of Systems: No fever or chills No CP or SOB No nausea, vomiting or diarrhea  As per HPI- otherwise negative.   Physical Examination: Vitals:   10/30/16 1007  BP: 125/67  Pulse: 75  Temp: 98.1 F (36.7 C)  SpO2: 97%    Vitals:   10/30/16 1007  Weight: 223 lb (101.2 kg)  Height:  (1.575 m)   Body mass index is 40.79 kg/m. Ideal Body Weight: Weight in (lb) to have BMI = 25: 136.4  GEN: WDWN, NAD, Non-toxic, A & O x 3, obese, looks well HEENT: Atraumatic, Normocephalic. Neck supple. No masses, No LAD.  Bilateral TM wnl, oropharynx normal.  PEERL,EOMI.   Ears and Nose: No external deformity. CV: RRR, No M/G/R. No JVD. No thrill. No extra heart sounds. PULM: CTA B, no wheezes, crackles, rhonchi. No retractions. No resp. distress. No accessory muscle use. ABD: S, NT, ND. No rebound. No HSM. EXTR: No c/c/e NEURO Normal gait.  PSYCH: Normally interactive. Conversant. Not depressed or anxious appearing.  Calm demeanor.    Assessment and Plan: Hyperlipidemia, unspecified hyperlipidemia type - Plan: Lipid panel, atorvastatin (LIPITOR) 40 MG tablet  Essential hypertension  Pre-diabetes - Plan: Comprehensive metabolic panel, Hemoglobin A1c  Medication monitoring encounter - Plan: CBC  Immunization due - Plan: Flu Vaccine QUAD 36+ mos IM (Fluarix & Fluzone Quad PF  Here today for a follow-up visit Will follow-up on her labs today Refilled lipitor Flu shot today- she will let me know if any adverse effects of her flu shot this year  Signed Abbe Amsterdam, MD  Received her labs- overall very good. Letter to pt  Results for orders placed or performed in visit on 10/30/16  CBC  Result Value Ref Range   WBC 4.5 4.0 - 10.5 K/uL   RBC 4.42 3.87 - 5.11 Mil/uL   Platelets 289.0 150.0 - 400.0 K/uL   Hemoglobin 12.0 12.0 - 15.0 g/dL   HCT 16.1 09.6 - 04.5 %   MCV 83.3 78.0 - 100.0 fl   MCHC 32.5 30.0 - 36.0 g/dL   RDW 40.9 81.1 - 91.4 %  Comprehensive metabolic panel  Result Value Ref Range   Sodium 141 135 - 145 mEq/L   Potassium 4.4 3.5 - 5.1 mEq/L   Chloride 104 96 - 112 mEq/L   CO2 30 19 - 32 mEq/L   Glucose, Bld 111 (H) 70 - 99 mg/dL   BUN 22 6 - 23 mg/dL   Creatinine, Ser 7.82  0.40 - 1.20 mg/dL   Total Bilirubin 0.4 0.2 - 1.2 mg/dL   Alkaline Phosphatase 90 39 - 117 U/L   AST 18 0 - 37 U/L   ALT 18 0 - 35 U/L   Total Protein 7.0 6.0 - 8.3 g/dL   Albumin 3.9 3.5 - 5.2 g/dL   Calcium 9.1 8.4 - 95.6 mg/dL   GFR 21.30 >86.57 mL/min  Hemoglobin A1c  Result Value Ref Range  Hgb A1c MFr Bld 6.0 4.6 - 6.5 %  Lipid panel  Result Value Ref Range   Cholesterol 174 0 - 200 mg/dL   Triglycerides 161.0 (H) 0.0 - 149.0 mg/dL   HDL 96.04 >54.09 mg/dL   VLDL 81.1 0.0 - 91.4 mg/dL   LDL Cholesterol 98 0 - 99 mg/dL   Total CHOL/HDL Ratio 4    NonHDL 129.71

## 2016-10-30 ENCOUNTER — Ambulatory Visit (INDEPENDENT_AMBULATORY_CARE_PROVIDER_SITE_OTHER): Payer: BC Managed Care – PPO | Admitting: Family Medicine

## 2016-10-30 VITALS — BP 125/67 | HR 75 | Temp 98.1°F | Ht 62.0 in | Wt 223.0 lb

## 2016-10-30 DIAGNOSIS — R7303 Prediabetes: Secondary | ICD-10-CM | POA: Diagnosis not present

## 2016-10-30 DIAGNOSIS — Z5181 Encounter for therapeutic drug level monitoring: Secondary | ICD-10-CM | POA: Diagnosis not present

## 2016-10-30 DIAGNOSIS — E785 Hyperlipidemia, unspecified: Secondary | ICD-10-CM

## 2016-10-30 DIAGNOSIS — Z23 Encounter for immunization: Secondary | ICD-10-CM

## 2016-10-30 DIAGNOSIS — I10 Essential (primary) hypertension: Secondary | ICD-10-CM | POA: Diagnosis not present

## 2016-10-30 LAB — COMPREHENSIVE METABOLIC PANEL
ALT: 18 U/L (ref 0–35)
AST: 18 U/L (ref 0–37)
Albumin: 3.9 g/dL (ref 3.5–5.2)
Alkaline Phosphatase: 90 U/L (ref 39–117)
BUN: 22 mg/dL (ref 6–23)
CALCIUM: 9.1 mg/dL (ref 8.4–10.5)
CHLORIDE: 104 meq/L (ref 96–112)
CO2: 30 meq/L (ref 19–32)
CREATININE: 0.67 mg/dL (ref 0.40–1.20)
GFR: 94.43 mL/min (ref >60.00)
GLUCOSE: 111 mg/dL — AB (ref 70–99)
Potassium: 4.4 mEq/L (ref 3.5–5.1)
Sodium: 141 mEq/L (ref 135–145)
Total Bilirubin: 0.4 mg/dL (ref 0.2–1.2)
Total Protein: 7 g/dL (ref 6.0–8.3)

## 2016-10-30 LAB — LIPID PANEL
CHOL/HDL RATIO: 4
CHOLESTEROL: 174 mg/dL (ref 0–200)
HDL: 44.3 mg/dL (ref >39.00)
LDL CALC: 98 mg/dL (ref 0–99)
NonHDL: 129.71
Triglycerides: 160 mg/dL — ABNORMAL HIGH (ref 0.0–149.0)
VLDL: 32 mg/dL (ref 0.0–40.0)

## 2016-10-30 LAB — CBC
HCT: 36.8 % (ref 36.0–46.0)
Hemoglobin: 12 g/dL (ref 12.0–15.0)
MCHC: 32.5 g/dL (ref 30.0–36.0)
MCV: 83.3 fl (ref 78.0–100.0)
PLATELETS: 289 10*3/uL (ref 150.0–400.0)
RBC: 4.42 Mil/uL (ref 3.87–5.11)
RDW: 13.8 % (ref 11.5–15.5)
WBC: 4.5 10*3/uL (ref 4.0–10.5)

## 2016-10-30 LAB — HEMOGLOBIN A1C: Hgb A1c MFr Bld: 6 % (ref 4.6–6.5)

## 2016-10-30 MED ORDER — ATORVASTATIN CALCIUM 40 MG PO TABS: ORAL_TABLET | ORAL | 3 refills | Status: DC

## 2016-10-30 NOTE — Patient Instructions (Signed)
Always a pleasure to see you- please let me know if you have any trouble with your flu shot this time!  I will be in touch with your labs- assuming all is ok we can plan to visit in about 6 months Please ask you GI doctor if you need to continue prilosec- you might be able to stop this medication depending on symptoms  congratulations on your new grandson!

## 2016-11-28 ENCOUNTER — Ambulatory Visit
Admission: RE | Admit: 2016-11-28 | Discharge: 2016-11-28 | Disposition: A | Discharge status: Home / Self Care | Payer: BC Managed Care – PPO | Source: Ambulatory Visit | Attending: Surgical Oncology | Admitting: Surgical Oncology

## 2016-11-28 DIAGNOSIS — R920 Mammographic microcalcification found on diagnostic imaging of breast: Secondary | ICD-10-CM

## 2017-04-13 ENCOUNTER — Telehealth: Payer: Self-pay | Admitting: Family Medicine

## 2017-04-13 DIAGNOSIS — I10 Essential (primary) hypertension: Secondary | ICD-10-CM

## 2017-04-13 NOTE — Telephone Encounter (Signed)
Copied from CRM [id number]. Topic: Quick Communication - Rx Refill/Question >> Apr 13, 2017 12:07 PM Floria RavelingStovall, Shana A wrote: Medication:  metoprolol succinate (TOPROL-XL) 100 MG 24 hr tablet [[id number]][[id number]]   Has the patient contacted their pharmacy? yes   (Agent: If no, request that the patient contact the pharmacy for the refill.)   Preferred Pharmacy (with phone number or street name): [facility] in [place]    Agent: Please be advised that RX refills may take up to 3 business days. We ask that you follow-up with your pharmacy.

## 2017-04-13 NOTE — Telephone Encounter (Signed)
Left message on voicemail for the pt to return call to determine which [facility] in [place] the pt would like to receive the medication.

## 2017-04-13 NOTE — Telephone Encounter (Signed)
Attempted to contact pt. To [facility] [place]; left voice message to return call.

## 2017-10-14 NOTE — Progress Notes (Addendum)
[facility] [address], [address] [place], [place] [address] [phone]  Date:  10/17/2017   Name:  Kristen Scott   DOB:  [birth year]   MRN:  [id number]  PCP:  Pearline Cables, MD    Chief Complaint: Medication Follow Up (needing flu shot) and Hypertension   History of Present Illness:  Kristen Scott is a 62 y.o. very pleasant female patient who presents with the following:  History of HTN, hyperlipidemia, obesity, pre-diabetes- following up today I last saw her about a year ago  History of obesity, chronic pain, depression, joint disease, pre-diabetes, hyperlipidemia I saw her last fall for a pre-operative evaluation prior to a carpal tunnel release procedure She enjoys playing the piano and was hoping to get her hand function back up to par She is taking lipitor, cymbalta, toprol., prilosec She is fasting today for labs  Her carpal tunnel release went very well- she had the left only and is pleased with the results After her flu shot last year she got pneumonia but we think this is unrelated as her sx started within hours  No rash, swelling or other signs of allergic reaction.  She would like to try a flu shot again She does take prilosec daily- she has GERD, but never had an ulcer.  She does see GI annually- will ask them if she needs to continue to take prilosec at next follo-up visit She does take hydrocodone on occasion for joint pain Her daughter just had a healthy baby boy- her son also has a daughter who is nearly 2.  She is thrilled with her grandchildren   Due for flu shot- give today  Also encourage to update tetanus- she is  Due and will be on medicare soon- will give her today  Labs done a year ago- she is fasting today  BP Readings from Last 3 Encounters:  10/17/17 132/90  10/30/16 125/67  12/23/15 (!) 138/92   She sees GI and is on a PPI  She does see GYN and is up to date on her pap   Her grandson is  12 month, granddaughter is 67 yo  She gets her cymbalta from her psychiatrist   Overall she is feeling well and does not have any other concerns today   Patient Active Problem List   Diagnosis Date Noted  . Carpal tunnel syndrome 09/30/2015  . Pre-diabetes 10/05/2014  . GERD (gastroesophageal reflux disease) 03/05/2014  . Vitamin D deficiency 08/20/2011  . Hyperlipidemia 08/20/2011  . HTN (hypertension) 08/20/2011  . Obesity, Class II, BMI 35-39.9 08/20/2011  . Chronic pain disorder 08/20/2011  . Depression 08/20/2011  . DJD (degenerative joint disease) of knee 08/20/2011  . Arthritis, low back 03/30/2011  . Arthritis of neck (HCC) 03/30/2011    Past Medical History:  Diagnosis Date  . Arthritis   . Depression   . DJD (degenerative joint disease)    left knee  . GERD (gastroesophageal reflux disease)   . Hearing loss, sensorineural   . HTN (hypertension)   . Hyperlipidemia   . Migraine   . Osteopenia     Past Surgical History:  Procedure Laterality Date  . BREAST BIOPSY Right 05/25/2016  . JOINT REPLACEMENT  2008   left knee    Social History   Tobacco Use  . Smoking status: Never Smoker  . Smokeless tobacco: Never Used  Substance Use Topics  . Alcohol use: No  . Drug  use: No    Family History  Problem Relation Age of Onset  . Dementia Mother   . Diabetes Father   . Diabetes Brother   . Mental illness Daughter        anxiety, OCD  . Diabetes Sister   . Diabetes Brother   . Heart attack Maternal Grandfather     Allergies  Allergen Reactions  . Relafen [Nabumetone] Rash    Medication list has been reviewed and updated.  Current Outpatient Medications on File Prior to Visit  Medication Sig Dispense Refill  . atorvastatin (LIPITOR) 40 MG tablet TAKE 1 TABLET BY MOUTH ONCE DAILY 90 tablet 3  . DULoxetine (CYMBALTA) 30 MG capsule Take 90 mg by mouth daily.    Marland Kitchen HYDROcodone-acetaminophen (NORCO) 10-325 MG per tablet Take 1 tablet by mouth 2 (two)  times daily.    . metoprolol succinate (TOPROL-XL) 100 MG 24 hr tablet take 1 tablet by mouth daily 90 tablet 3  . omeprazole (PRILOSEC) 20 MG capsule Take 20 mg by mouth daily.     No current facility-administered medications on file prior to visit.     Review of Systems:  As per HPI- otherwise negative.  No fever or chills No CP or SOB   Physical Examination: Vitals:   10/17/17 1058  BP: 132/90  Pulse: 73  Resp: 16  Temp: 97.8 F (36.6 C)  SpO2: 97%   Vitals:   10/17/17 1058  Weight: 217 lb (98.4 kg)  Height: 5\' 2"  (1.575 m)   Body mass index is 39.69 kg/m. Ideal Body Weight: Weight in (lb) to have BMI = 25: 136.4  GEN: WDWN, NAD, Non-toxic, A & O x 3, looks well, obese  HEENT: Atraumatic, Normocephalic. Neck supple. No masses, No LAD. Ears and Nose: No external deformity. CV: RRR, No M/G/R. No JVD. No thrill. No extra heart sounds. PULM: CTA B, no wheezes, crackles, rhonchi. No retractions. No resp. distress. No accessory muscle use. EXTR: No c/c/e NEURO Normal gait.  PSYCH: Normally interactive. Conversant. Not depressed or anxious appearing.  Calm demeanor.    Assessment and Plan: Essential hypertension - Plan: CBC, Comprehensive metabolic panel  Hyperlipidemia, unspecified hyperlipidemia type - Plan: Lipid panel, atorvastatin (LIPITOR) 40 MG tablet  Pre-diabetes - Plan: Comprehensive metabolic panel, Hemoglobin A1c  Medication monitoring encounter - Plan: CBC, Comprehensive metabolic panel  Immunization due - Plan: Flu Vaccine QUAD 6+ mos PF IM (Fluarix Quad PF), Td vaccine greater than or equal to 7yo preservative free IM  Following up today Labs pending as above BP under ok control Refilled cholesterol med today Flu shot and td given   Signed Abbe Amsterdam, MD  Received her labs Results for orders placed or performed in visit on 10/17/17  CBC  Result Value Ref Range   WBC 6.3 4.0 - 10.5 K/uL   RBC 4.45 3.87 - 5.11 Mil/uL   Platelets 294.0  150.0 - 400.0 K/uL   Hemoglobin 12.2 12.0 - 15.0 g/dL   HCT 29.5 (L) 62.1 - 30.8 %   MCV 80.1 78.0 - 100.0 fl   MCHC 34.1 30.0 - 36.0 g/dL   RDW 65.7 84.6 - 96.2 %  Comprehensive metabolic panel  Result Value Ref Range   Sodium 141 135 - 145 mEq/L   Potassium 4.0 3.5 - 5.1 mEq/L   Chloride 105 96 - 112 mEq/L   CO2 30 19 - 32 mEq/L   Glucose, Bld 99 70 - 99 mg/dL   BUN 19 6 - 23  mg/dL   Creatinine, Ser 4.59 0.40 - 1.20 mg/dL   Total Bilirubin 0.5 0.2 - 1.2 mg/dL   Alkaline Phosphatase 90 39 - 117 U/L   AST 16 0 - 37 U/L   ALT 17 0 - 35 U/L   Total Protein 7.0 6.0 - 8.3 g/dL   Albumin 4.1 3.5 - 5.2 g/dL   Calcium 9.2 8.4 - 97.7 mg/dL   GFR 41.42 >39.53 mL/min  Hemoglobin A1c  Result Value Ref Range   Hgb A1c MFr Bld 6.2 4.6 - 6.5 %  Lipid panel  Result Value Ref Range   Cholesterol 165 0 - 200 mg/dL   Triglycerides 202.3 (H) 0.0 - 149.0 mg/dL   HDL 34.35 >68.61 mg/dL   VLDL 68.3 (H) 0.0 - 72.9 mg/dL   Total CHOL/HDL Ratio 3    NonHDL 116.67   LDL cholesterol, direct  Result Value Ref Range   Direct LDL 90.0 mg/dL  letter to pt

## 2017-10-17 ENCOUNTER — Ambulatory Visit: Payer: BC Managed Care – PPO | Admitting: Family Medicine

## 2017-10-17 ENCOUNTER — Encounter: Payer: Self-pay | Admitting: Family Medicine

## 2017-10-17 VITALS — BP 132/90 | HR 73 | Temp 97.8°F | Resp 16 | Ht 62.0 in | Wt 217.0 lb

## 2017-10-17 DIAGNOSIS — Z23 Encounter for immunization: Secondary | ICD-10-CM

## 2017-10-17 DIAGNOSIS — Z5181 Encounter for therapeutic drug level monitoring: Secondary | ICD-10-CM | POA: Diagnosis not present

## 2017-10-17 DIAGNOSIS — E785 Hyperlipidemia, unspecified: Secondary | ICD-10-CM | POA: Diagnosis not present

## 2017-10-17 DIAGNOSIS — R7303 Prediabetes: Secondary | ICD-10-CM | POA: Diagnosis not present

## 2017-10-17 DIAGNOSIS — I10 Essential (primary) hypertension: Secondary | ICD-10-CM

## 2017-10-17 LAB — CBC
HEMATOCRIT: 35.7 % — AB (ref 36.0–46.0)
Hemoglobin: 12.2 g/dL (ref 12.0–15.0)
MCHC: 34.1 g/dL (ref 30.0–36.0)
MCV: 80.1 fl (ref 78.0–100.0)
PLATELETS: 294 10*3/uL (ref 150.0–400.0)
RBC: 4.45 Mil/uL (ref 3.87–5.11)
RDW: 13.8 % (ref 11.5–15.5)
WBC: 6.3 10*3/uL (ref 4.0–10.5)

## 2017-10-17 LAB — COMPREHENSIVE METABOLIC PANEL
ALBUMIN: 4.1 g/dL (ref 3.5–5.2)
ALT: 17 U/L (ref 0–35)
AST: 16 U/L (ref 0–37)
Alkaline Phosphatase: 90 U/L (ref 39–117)
BUN: 19 mg/dL (ref 6–23)
CALCIUM: 9.2 mg/dL (ref 8.4–10.5)
CO2: 30 mEq/L (ref 19–32)
Chloride: 105 mEq/L (ref 96–112)
Creatinine, Ser: 0.69 mg/dL (ref 0.40–1.20)
GFR: 91 mL/min (ref >60.00)
Glucose, Bld: 99 mg/dL (ref 70–99)
POTASSIUM: 4 meq/L (ref 3.5–5.1)
SODIUM: 141 meq/L (ref 135–145)
TOTAL PROTEIN: 7 g/dL (ref 6.0–8.3)
Total Bilirubin: 0.5 mg/dL (ref 0.2–1.2)

## 2017-10-17 LAB — LIPID PANEL
CHOLESTEROL: 165 mg/dL (ref 0–200)
HDL: 48.5 mg/dL (ref >39.00)
NonHDL: 116.67
Total CHOL/HDL Ratio: 3
Triglycerides: 212 mg/dL — ABNORMAL HIGH (ref 0.0–149.0)
VLDL: 42.4 mg/dL — ABNORMAL HIGH (ref 0.0–40.0)

## 2017-10-17 LAB — LDL CHOLESTEROL, DIRECT: Direct LDL: 90 mg/dL

## 2017-10-17 LAB — HEMOGLOBIN A1C: HEMOGLOBIN A1C: 6.2 % (ref 4.6–6.5)

## 2017-10-17 MED ORDER — ATORVASTATIN CALCIUM 40 MG PO TABS: ORAL_TABLET | ORAL | 3 refills | Status: DC

## 2017-10-17 NOTE — Patient Instructions (Addendum)
Good to see you today!  Take care and I will be in touch with your labs asap  Your blood pressure is under ok control You got your flu shot and your tetanus vaccine today   Health Maintenance, Female Adopting a healthy lifestyle and getting preventive care can go a long way to promote health and wellness. Talk with your health care provider about what schedule of regular examinations is right for you. This is a good chance for you to check in with your provider about disease prevention and staying healthy. In between checkups, there are plenty of things you can do on your own. Experts have done a lot of research about which lifestyle changes and preventive measures are most likely to keep you healthy. Ask your health care provider for more information. Weight and diet Eat a healthy diet  Be sure to include plenty of vegetables, fruits, low-fat dairy products, and lean protein.  Do not eat a lot of foods high in solid fats, added sugars, or salt.  Get regular exercise. This is one of the most important things you can do for your health. ? Most adults should exercise for at least 150 minutes each week. The exercise should increase your heart rate and make you sweat (moderate-intensity exercise). ? Most adults should also do strengthening exercises at least twice a week. This is in addition to the moderate-intensity exercise.  Maintain a healthy weight  Body mass index (BMI) is a measurement that can be used to identify possible weight problems. It estimates body fat based on height and weight. Your health care provider can help determine your BMI and help you achieve or maintain a healthy weight.  For females 64 years of age and older: ? A BMI below 18.5 is considered underweight. ? A BMI of 18.5 to 24.9 is normal. ? A BMI of 25 to 29.9 is considered overweight. ? A BMI of 30 and above is considered obese.  Watch levels of cholesterol and blood lipids  You should start having your blood  tested for lipids and cholesterol at 64 years of age, then have this test every 5 years.  You may need to have your cholesterol levels checked more often if: ? Your lipid or cholesterol levels are high. ? You are older than 64 years of age. ? You are at high risk for heart disease.  Cancer screening Lung Cancer  Lung cancer screening is recommended for adults 25-38 years old who are at high risk for lung cancer because of a history of smoking.  A yearly low-dose CT scan of the lungs is recommended for people who: ? Currently smoke. ? Have quit within the past 15 years. ? Have at least a 30-pack-year history of smoking. A pack year is smoking an average of one pack of cigarettes a day for 1 year.  Yearly screening should continue until it has been 15 years since you quit.  Yearly screening should stop if you develop a health problem that would prevent you from having lung cancer treatment.  Breast Cancer  Practice breast self-awareness. This means understanding how your breasts normally appear and feel.  It also means doing regular breast self-exams. Let your health care provider know about any changes, no matter how small.  If you are in your 64s or 30s, you should have a clinical breast exam (CBE) by a health care provider every 1-3 years as part of a regular health exam.  If you are 64 or older, have a  CBE every year. Also consider having a breast X-ray (mammogram) every year.  If you have a family history of breast cancer, talk to your health care provider about genetic screening.  If you are at high risk for breast cancer, talk to your health care provider about having an MRI and a mammogram every year.  Breast cancer gene (BRCA) assessment is recommended for women who have family members with BRCA-related cancers. BRCA-related cancers include: ? Breast. ? Ovarian. ? Tubal. ? Peritoneal cancers.  Results of the assessment will determine the need for genetic counseling and  BRCA1 and BRCA2 testing.  Cervical Cancer Your health care provider may recommend that you be screened regularly for cancer of the pelvic organs (ovaries, uterus, and vagina). This screening involves a pelvic examination, including checking for microscopic changes to the surface of your cervix (Pap test). You may be encouraged to have this screening done every 3 years, beginning at age 64.  For women ages 64-65, health care providers may recommend pelvic exams and Pap testing every 3 years, or they may recommend the Pap and pelvic exam, combined with testing for human papilloma virus (HPV), every 5 years. Some types of HPV increase your risk of cervical cancer. Testing for HPV may also be done on women of any age with unclear Pap test results.  Other health care providers may not recommend any screening for nonpregnant women who are considered low risk for pelvic cancer and who do not have symptoms. Ask your health care provider if a screening pelvic exam is right for you.  If you have had past treatment for cervical cancer or a condition that could lead to cancer, you need Pap tests and screening for cancer for at least 20 years after your treatment. If Pap tests have been discontinued, your risk factors (such as having a new sexual partner) need to be reassessed to determine if screening should resume. Some women have medical problems that increase the chance of getting cervical cancer. In these cases, your health care provider may recommend more frequent screening and Pap tests.  Colorectal Cancer  This type of cancer can be detected and often prevented.  Routine colorectal cancer screening usually begins at 64 years of age and continues through 64 years of age.  Your health care provider may recommend screening at an earlier age if you have risk factors for colon cancer.  Your health care provider may also recommend using home test kits to check for hidden blood in the stool.  A small camera  at the end of a tube can be used to examine your colon directly (sigmoidoscopy or colonoscopy). This is done to check for the earliest forms of colorectal cancer.  Routine screening usually begins at age 1.  Direct examination of the colon should be repeated every 5-10 years through 63 years of age. However, you may need to be screened more often if early forms of precancerous polyps or small growths are found.  Skin Cancer  Check your skin from head to toe regularly.  Tell your health care provider about any new moles or changes in moles, especially if there is a change in a mole's shape or color.  Also tell your health care provider if you have a mole that is larger than the size of a pencil eraser.  Always use sunscreen. Apply sunscreen liberally and repeatedly throughout the day.  Protect yourself by wearing long sleeves, pants, a wide-brimmed hat, and sunglasses whenever you are outside.  Heart disease,  diabetes, and high blood pressure  High blood pressure causes heart disease and increases the risk of stroke. High blood pressure is more likely to develop in: ? People who have blood pressure in the high end of the normal range (130-139/85-89 mm Hg). ? People who are overweight or obese. ? People who are African American.  If you are 74-33 years of age, have your blood pressure checked every 3-5 years. If you are 80 years of age or older, have your blood pressure checked every year. You should have your blood pressure measured twice-once when you are at a hospital or clinic, and once when you are not at a hospital or clinic. Record the average of the two measurements. To check your blood pressure when you are not at a hospital or clinic, you can use: ? An automated blood pressure machine at a pharmacy. ? A home blood pressure monitor.  If you are between 30 years and 39 years old, ask your health care provider if you should take aspirin to prevent strokes.  Have regular diabetes  screenings. This involves taking a blood sample to check your fasting blood sugar level. ? If you are at a normal weight and have a low risk for diabetes, have this test once every three years after 64 years of age. ? If you are overweight and have a high risk for diabetes, consider being tested at a younger age or more often. Preventing infection Hepatitis B  If you have a higher risk for hepatitis B, you should be screened for this virus. You are considered at high risk for hepatitis B if: ? You were born in a country where hepatitis B is common. Ask your health care provider which countries are considered high risk. ? Your parents were born in a high-risk country, and you have not been immunized against hepatitis B (hepatitis B vaccine). ? You have HIV or AIDS. ? You use needles to inject street drugs. ? You live with someone who has hepatitis B. ? You have had sex with someone who has hepatitis B. ? You get hemodialysis treatment. ? You take certain medicines for conditions, including cancer, organ transplantation, and autoimmune conditions.  Hepatitis C  Blood testing is recommended for: ? Everyone born from [birth year] through 1967. ? Anyone with known risk factors for hepatitis C.  Sexually transmitted infections (STIs)  You should be screened for sexually transmitted infections (STIs) including gonorrhea and chlamydia if: ? You are sexually active and are younger than 64 years of age. ? You are older than 64 years of age and your health care provider tells you that you are at risk for this type of infection. ? Your sexual activity has changed since you were last screened and you are at an increased risk for chlamydia or gonorrhea. Ask your health care provider if you are at risk.  If you do not have HIV, but are at risk, it may be recommended that you take a prescription medicine daily to prevent HIV infection. This is called pre-exposure prophylaxis (PrEP). You are considered at risk  if: ? You are sexually active and do not regularly use condoms or know the HIV status of your partner(s). ? You take drugs by injection. ? You are sexually active with a partner who has HIV.  Talk with your health care provider about whether you are at high risk of being infected with HIV. If you choose to begin PrEP, you should first be tested for HIV. You should  then be tested every 3 months for as long as you are taking PrEP. Pregnancy  If you are premenopausal and you may become pregnant, ask your health care provider about preconception counseling.  If you may become pregnant, take 400 to 800 micrograms (mcg) of folic acid every day.  If you want to prevent pregnancy, talk to your health care provider about birth control (contraception). Osteoporosis and menopause  Osteoporosis is a disease in which the bones lose minerals and strength with aging. This can result in serious bone fractures. Your risk for osteoporosis can be identified using a bone density scan.  If you are 24 years of age or older, or if you are at risk for osteoporosis and fractures, ask your health care provider if you should be screened.  Ask your health care provider whether you should take a calcium or vitamin D supplement to lower your risk for osteoporosis.  Menopause may have certain physical symptoms and risks.  Hormone replacement therapy may reduce some of these symptoms and risks. Talk to your health care provider about whether hormone replacement therapy is right for you. Follow these instructions at home:  Schedule regular health, dental, and eye exams.  Stay current with your immunizations.  Do not use any tobacco products including cigarettes, chewing tobacco, or electronic cigarettes.  If you are pregnant, do not drink alcohol.  If you are breastfeeding, limit how much and how often you drink alcohol.  Limit alcohol intake to no more than 1 drink per day for nonpregnant women. One drink  equals 12 ounces of beer, 5 ounces of wine, or 1 ounces of hard liquor.  Do not use street drugs.  Do not share needles.  Ask your health care provider for help if you need support or information about quitting drugs.  Tell your health care provider if you often feel depressed.  Tell your health care provider if you have ever been abused or do not feel safe at home. This information is not intended to replace advice given to you by your health care provider. Make sure you discuss any questions you have with your health care provider. Document Released: 08/08/2010 Document Revised: 07/01/2015 Document Reviewed: 10/27/2014 Elsevier Interactive Patient Education  [employer].

## 2017-10-29 ENCOUNTER — Encounter: Payer: Self-pay | Admitting: Family Medicine

## 2017-10-29 DIAGNOSIS — R7303 Prediabetes: Secondary | ICD-10-CM

## 2017-10-29 MED ORDER — METFORMIN HCL 500 MG PO TABS: 500.0000 mg | ORAL_TABLET | Freq: Every day | ORAL | 3 refills | Status: DC

## 2017-12-25 ENCOUNTER — Other Ambulatory Visit: Payer: Self-pay | Admitting: Family Medicine

## 2017-12-25 DIAGNOSIS — E785 Hyperlipidemia, unspecified: Secondary | ICD-10-CM

## 2018-01-04 ENCOUNTER — Other Ambulatory Visit: Payer: Self-pay | Admitting: Family Medicine

## 2018-01-04 DIAGNOSIS — I10 Essential (primary) hypertension: Secondary | ICD-10-CM

## 2018-03-14 ENCOUNTER — Ambulatory Visit: Payer: BC Managed Care – PPO | Admitting: Family Medicine

## 2018-03-17 NOTE — Progress Notes (Deleted)
Kristen Scott, Kristen Scott, Kristen Scott, Kristen Scott, Kristen Scott of Present Illness:  Kristen Scott is a 63 y.o. very pleasant female patient who presents with the following:  Here today for recheck, and discuss medications Scott of hypertension, hyperlipidemia, obesity, prediabetes At her last visit her A1c was higher, we started her on metformin Lab Results  Component Value Date   HGBA1C 6.2 09/Scott/2019   Flu shot is up-to-date, suggest Shingrix  Patient Active Problem List   Diagnosis Date Noted  . Carpal tunnel syndrome 09/30/2015  . Pre-diabetes 10/05/2014  . GERD (gastroesophageal reflux disease) 03/05/2014  . Vitamin D deficiency 08/20/2011  . Hyperlipidemia 08/20/2011  . HTN (hypertension) 08/20/2011  . Obesity, Class II, BMI 35-39.9 08/20/2011  . Chronic pain disorder 08/20/2011  . Depression 08/20/2011  . DJD (degenerative joint disease) of knee 08/20/2011  . Arthritis, low back 03/30/2011  . Arthritis of neck (HCC) 03/30/2011    Past Medical Scott:  Diagnosis Date  . Arthritis   . Depression   . DJD (degenerative joint disease)    left knee  . GERD (gastroesophageal reflux disease)   . Hearing loss, sensorineural   . HTN (hypertension)   . Hyperlipidemia   . Migraine   . Osteopenia     Past Surgical Scott:  Procedure Laterality Date  . BREAST BIOPSY Right 05/25/2016  . JOINT REPLACEMENT  2008   left knee    Social Scott   Tobacco Use  . Smoking status: Never Smoker  . Smokeless tobacco: Never Used  Substance Use Topics  . Alcohol use: No  . Drug use: No    Family Scott  Problem Relation Age of Onset  . Dementia Mother   . Diabetes Father   . Diabetes Brother   . Mental illness Daughter    anxiety, OCD  . Diabetes Sister   . Diabetes Brother   . Heart attack Maternal Grandfather     Allergies  Allergen Reactions  . Relafen [Nabumetone] Rash    Medication list has been reviewed and updated.  Current Outpatient Medications on File Prior to Visit  Medication Sig Dispense Refill  . atorvastatin (LIPITOR) 40 MG tablet TAKE 1 TABLET BY MOUTH ONCE DAILY 90 tablet 3  . DULoxetine (CYMBALTA) 30 MG capsule Take 90 mg by mouth daily.    Marland Kitchen HYDROcodone-acetaminophen (NORCO) 10-325 MG per tablet Take 1 tablet by mouth 2 (two) times daily.    . metFORMIN (GLUCOPHAGE) 500 MG tablet Take 1 tablet (500 mg total) by mouth daily. 90 tablet 3  . metoprolol succinate (TOPROL-XL) 100 MG 24 hr tablet TAKE 1 TABLET BY MOUTH EVERY DAY 90 tablet 1  . omeprazole (PRILOSEC) 20 MG capsule Take 20 mg by mouth daily.     No current facility-administered medications on file prior to visit.     Review of Systems:  As per HPI- otherwise negative.   Physical Examination: There were no vitals filed for this visit. There were no vitals filed for this visit. There is no height or weight on file to calculate BMI. Ideal Body Weight:    GEN: WDWN, NAD, Non-toxic, A & O x 3 HEENT: Atraumatic, Normocephalic. Neck supple. No masses, No  LAD. Ears and Nose: No external deformity. CV: RRR, No M/G/R. No JVD. No thrill. No extra heart sounds. PULM: CTA B, no wheezes, crackles, rhonchi. No retractions. No resp. distress. No accessory muscle use. ABD: S, NT, ND, +BS. No rebound. No HSM. EXTR: No c/c/e NEURO Normal gait.  PSYCH: Normally interactive. Conversant. Not depressed or anxious appearing.  Calm demeanor.    Assessment and Plan: ***  Signed Lamar Blinks, Kristen

## 2018-03-20 ENCOUNTER — Ambulatory Visit: Payer: BC Managed Care – PPO | Admitting: Family Medicine

## 2018-05-15 ENCOUNTER — Encounter: Payer: Self-pay | Admitting: Family Medicine

## 2018-05-15 DIAGNOSIS — E785 Hyperlipidemia, unspecified: Secondary | ICD-10-CM

## 2018-05-16 MED ORDER — ATORVASTATIN CALCIUM 40 MG PO TABS: ORAL_TABLET | ORAL | 3 refills | Status: DC

## 2018-07-02 ENCOUNTER — Other Ambulatory Visit: Payer: Self-pay | Admitting: Family Medicine

## 2018-07-02 DIAGNOSIS — I10 Essential (primary) hypertension: Secondary | ICD-10-CM

## 2018-07-03 ENCOUNTER — Encounter: Payer: Self-pay | Admitting: Family Medicine

## 2018-07-03 ENCOUNTER — Ambulatory Visit (INDEPENDENT_AMBULATORY_CARE_PROVIDER_SITE_OTHER): Payer: BC Managed Care – PPO | Admitting: Family Medicine

## 2018-07-03 ENCOUNTER — Other Ambulatory Visit: Payer: Self-pay

## 2018-07-03 DIAGNOSIS — E785 Hyperlipidemia, unspecified: Secondary | ICD-10-CM | POA: Diagnosis not present

## 2018-07-03 DIAGNOSIS — R7303 Prediabetes: Secondary | ICD-10-CM | POA: Diagnosis not present

## 2018-07-03 DIAGNOSIS — I10 Essential (primary) hypertension: Secondary | ICD-10-CM

## 2018-07-03 DIAGNOSIS — Z5181 Encounter for therapeutic drug level monitoring: Secondary | ICD-10-CM | POA: Diagnosis not present

## 2018-07-03 MED ORDER — METOPROLOL SUCCINATE ER 100 MG PO TB24: 100.0000 mg | ORAL_TABLET | Freq: Every day | ORAL | 3 refills | Status: DC

## 2018-07-03 NOTE — Patient Instructions (Signed)
It was great to talk with you today! I will be in touch once I have your lab results Please do be sure to reschedule your mammogram ASAP As we discussed, I would encourage you to have the new shingles vaccine.  It is called Shingrix, it is a 2 shot series.  You will probably need to have this given at your pharmacy, as you will be on Medicare soon.

## 2018-07-03 NOTE — Progress Notes (Addendum)
[facility] [address] [place], [place] [address] [phone]  Date:  07/03/2018   Name:  Kristen Scott   DOB:  October 01, 1955   MRN:  [id number]  PCP:  Pearline Cables, MD    Chief Complaint: No chief complaint on file.   History of Present Illness:  Kristen Scott is a 63 y.o. 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 but they have stayed open during the pandemic Patient identity confirmed with name and date of birth, she gives consent for visit today  History of HTN, hyperlipidemia pre-diabetes and obesity   Last with myself was in the fall  She gets her cymbalta from her mental health care provider, other medications from myself Her carpal tunnel release went well- she is able to play the piano again which she really enjoys  Her grandson is turning 2 and her granddaughter is 4  Mammo; she needs to call and re-schedule this - got delayed due to pandemic  She would like to come in for a lab visit soon She is not currently checking her blood pressure at home.  However her daughter is a Engineer, civil (consulting), she will ask her daughter to check her blood pressure and pulse next time they see each other   Patient Active Problem List   Diagnosis Date Noted  . Carpal tunnel syndrome 09/30/2015  . Pre-diabetes 10/05/2014  . GERD (gastroesophageal reflux disease) 03/05/2014  . Vitamin D deficiency 08/20/2011  . Hyperlipidemia 08/20/2011  . HTN (hypertension) 08/20/2011  . Obesity, Class II, BMI 35-39.9 08/20/2011  . Chronic pain disorder 08/20/2011  . Depression 08/20/2011  . DJD (degenerative joint disease) of knee 08/20/2011  . Arthritis, low back 03/30/2011  . Arthritis of neck (HCC) 03/30/2011    Past Medical History:  Diagnosis  Date  . Arthritis   . Depression   . DJD (degenerative joint disease)    left knee  . GERD (gastroesophageal reflux disease)   . Hearing loss, sensorineural   . HTN (hypertension)   . Hyperlipidemia   . Migraine   . Osteopenia     Past Surgical History:  Procedure Laterality Date  . BREAST BIOPSY Right 05/25/2016  . JOINT REPLACEMENT  2008   left knee    Social History   Tobacco Use  . Smoking status: Never Smoker  . Smokeless tobacco: Never Used  Substance Use Topics  . Alcohol use: No  . Drug use: No    Family History  Problem Relation Age of Onset  . Dementia Mother   . Diabetes Father   . Diabetes Brother   . Mental illness Daughter        anxiety, OCD  . Diabetes Sister   . Diabetes Brother   . Heart attack Maternal Grandfather     Allergies  Allergen Reactions  . Relafen [Nabumetone] Rash    Medication list has been reviewed and updated.  Current Outpatient Medications on File Prior to Visit  Medication Sig Dispense Refill  . atorvastatin (LIPITOR) 40 MG tablet TAKE 1 TABLET BY MOUTH ONCE DAILY 90 tablet 3  . DULoxetine (CYMBALTA) 30 MG capsule Take 90 mg by mouth daily.    Marland Kitchen HYDROcodone-acetaminophen (NORCO) 10-325 MG per tablet Take 1 tablet by mouth 2 (  two) times daily.    . metFORMIN (GLUCOPHAGE) 500 MG tablet Take 1 tablet (500 mg total) by mouth daily. 90 tablet 3  . metoprolol succinate (TOPROL-XL) 100 MG 24 hr tablet TAKE 1 TABLET BY MOUTH EVERY DAY 90 tablet 1  . omeprazole (PRILOSEC) 20 MG capsule Take 20 mg by mouth daily.     No current facility-administered medications on file prior to visit.     Review of Systems:  She has not been sick- no fever or cough   Physical Examination: There were no vitals filed for this visit. There were no vitals filed for this visit. There is no height or weight on file to calculate BMI. Ideal Body Weight:    She does not check her BP at home  Looks well, overweight.  No cough, wheezing, distress  is noted  BP Readings from Last 3 Encounters:  10/17/17 132/90  10/30/16 125/67  12/23/15 (!) 138/92     Assessment and Plan: Hyperlipidemia, unspecified hyperlipidemia type - Plan: Lipid panel  Essential hypertension - Plan: metoprolol succinate (TOPROL-XL) 100 MG 24 hr tablet  Pre-diabetes - Plan: Hemoglobin A1c  Medication monitoring encounter - Plan: CBC, Comprehensive metabolic panel  Routine follow-up visit today.  I ordered her labs, will bring her in for a lab visit at her convenience Encouraged her to get the shingles vaccine.  She will start on Medicare soon, will plan to have this done at her pharmacy Encouraged her to reschedule her mammogram  Sent the following message with instructions to her MyChart account------------- It was great to talk with you today! I will be in touch once I have your lab results Please do be sure to reschedule your mammogram ASAP As we discussed, I would encourage you to have the new shingles vaccine.  It is called Shingrix, it is a 2 shot series.  You will probably need to have this given at your pharmacy, as you will be on Medicare soon.  Signed Abbe AmsterdamJessica Sumie Remsen, MD  Received her labs 6/13- message to pt     Chemistry      Component Value Date/Time   NA 141 07/19/2018 0934   K 4.3 07/19/2018 0934   CL 104 07/19/2018 0934   CO2 29 07/19/2018 0934   BUN 18 07/19/2018 0934   CREATININE 0.77 07/19/2018 0934   CREATININE 0.85 10/08/2015 1102      Component Value Date/Time   CALCIUM 8.8 07/19/2018 0934   ALKPHOS 98 07/19/2018 0934   AST 18 07/19/2018 0934   ALT 20 07/19/2018 0934   BILITOT 0.5 07/19/2018 0934     Lab Results  Component Value Date   WBC 5.0 07/19/2018   HGB 11.7 (L) 07/19/2018   HCT 35.4 (L) 07/19/2018   MCV 82.9 07/19/2018   PLT 247.0 07/19/2018   Lab Results  Component Value Date   HGBA1C 6.5 07/19/2018   Lipids:    Component Value Date/Time   CHOL 158 07/19/2018 0934   TRIG 151.0 (H) 07/19/2018  0934   HDL 44.10 07/19/2018 0934   LDLDIRECT 90.0 10/17/2017 1130   VLDL 30.2 07/19/2018 0934   CHOLHDL 4 07/19/2018 0934

## 2018-07-19 ENCOUNTER — Other Ambulatory Visit (INDEPENDENT_AMBULATORY_CARE_PROVIDER_SITE_OTHER): Payer: BC Managed Care – PPO

## 2018-07-19 ENCOUNTER — Other Ambulatory Visit: Payer: Self-pay

## 2018-07-19 DIAGNOSIS — Z5181 Encounter for therapeutic drug level monitoring: Secondary | ICD-10-CM

## 2018-07-19 DIAGNOSIS — R7303 Prediabetes: Secondary | ICD-10-CM | POA: Diagnosis not present

## 2018-07-19 DIAGNOSIS — E785 Hyperlipidemia, unspecified: Secondary | ICD-10-CM

## 2018-07-19 LAB — LIPID PANEL
Cholesterol: 158 mg/dL (ref 0–200)
HDL: 44.1 mg/dL (ref >39.00)
LDL Cholesterol: 84 mg/dL (ref 0–99)
NonHDL: 113.84
Total CHOL/HDL Ratio: 4
Triglycerides: 151 mg/dL — ABNORMAL HIGH (ref 0.0–149.0)
VLDL: 30.2 mg/dL (ref 0.0–40.0)

## 2018-07-19 LAB — COMPREHENSIVE METABOLIC PANEL
ALT: 20 U/L (ref 0–35)
AST: 18 U/L (ref 0–37)
Albumin: 3.9 g/dL (ref 3.5–5.2)
Alkaline Phosphatase: 98 U/L (ref 39–117)
BUN: 18 mg/dL (ref 6–23)
CO2: 29 mEq/L (ref 19–32)
Calcium: 8.8 mg/dL (ref 8.4–10.5)
Chloride: 104 mEq/L (ref 96–112)
Creatinine, Ser: 0.77 mg/dL (ref 0.40–1.20)
GFR: 75.26 mL/min (ref >60.00)
Glucose, Bld: 116 mg/dL — ABNORMAL HIGH (ref 70–99)
Potassium: 4.3 mEq/L (ref 3.5–5.1)
Sodium: 141 mEq/L (ref 135–145)
Total Bilirubin: 0.5 mg/dL (ref 0.2–1.2)
Total Protein: 6.6 g/dL (ref 6.0–8.3)

## 2018-07-19 LAB — CBC
HCT: 35.4 % — ABNORMAL LOW (ref 36.0–46.0)
Hemoglobin: 11.7 g/dL — ABNORMAL LOW (ref 12.0–15.0)
MCHC: 33.1 g/dL (ref 30.0–36.0)
MCV: 82.9 fl (ref 78.0–100.0)
Platelets: 247 10*3/uL (ref 150.0–400.0)
RBC: 4.27 Mil/uL (ref 3.87–5.11)
RDW: 14.1 % (ref 11.5–15.5)
WBC: 5 10*3/uL (ref 4.0–10.5)

## 2018-07-19 LAB — HEMOGLOBIN A1C: Hgb A1c MFr Bld: 6.5 % (ref 4.6–6.5)

## 2018-07-20 ENCOUNTER — Encounter: Payer: Self-pay | Admitting: Family Medicine

## 2018-08-28 LAB — HM MAMMOGRAPHY

## 2018-08-29 ENCOUNTER — Encounter: Payer: Self-pay | Admitting: Family Medicine

## 2018-11-20 ENCOUNTER — Other Ambulatory Visit: Payer: Self-pay | Admitting: *Deleted

## 2018-11-20 DIAGNOSIS — R7303 Prediabetes: Secondary | ICD-10-CM

## 2018-11-20 MED ORDER — METFORMIN HCL 500 MG PO TABS: 500.0000 mg | ORAL_TABLET | Freq: Every day | ORAL | 3 refills | Status: DC

## 2019-02-13 ENCOUNTER — Encounter: Payer: Self-pay | Admitting: Family Medicine

## 2019-03-15 ENCOUNTER — Encounter: Payer: Self-pay | Admitting: Family Medicine

## 2019-03-20 ENCOUNTER — Ambulatory Visit: Payer: BC Managed Care – PPO

## 2019-04-05 ENCOUNTER — Ambulatory Visit: Payer: Self-pay | Attending: Internal Medicine

## 2019-04-05 DIAGNOSIS — Z23 Encounter for immunization: Secondary | ICD-10-CM | POA: Insufficient documentation

## 2019-04-05 NOTE — Progress Notes (Signed)
   Covid-19 Vaccination Clinic  Name:  Keajah Killough    MRN: [id number] DOB: 15-Sep-1955  04/05/2019  Ms. Kleinman was observed post Covid-19 immunization for 15 minutes without incidence. She was provided with Vaccine Information Sheet and instruction to access the V-Safe system.   Ms. Fouts was instructed to call 911 with any severe reactions post vaccine: Marland Kitchen Difficulty breathing  . Swelling of your face and throat  . A fast heartbeat  . A bad rash all over your body  . Dizziness and weakness    Immunizations Administered    Name Date Dose VIS Date Route   Pfizer COVID-19 Vaccine 04/05/2019  9:23 AM 0.3 mL 01/17/2019 Intramuscular   Manufacturer: [employer], [employer]   Lot: [id number]   NDC: [address]-6

## 2019-04-26 ENCOUNTER — Ambulatory Visit: Payer: Self-pay | Attending: Internal Medicine

## 2019-04-26 DIAGNOSIS — Z23 Encounter for immunization: Secondary | ICD-10-CM

## 2019-04-26 NOTE — Progress Notes (Signed)
   Covid-19 Vaccination Clinic  Name:  Kristen Scott    MRN: [id number] DOB: 04/06/1955  04/26/2019  [name] was observed post Covid-19 immunization for 15 minutes without incident. She was provided with Vaccine Information Sheet and instruction to access the V-Safe system.   [name] was instructed to call 911 with any severe reactions post vaccine: Marland Kitchen Difficulty breathing  . Swelling of face and throat  . A fast heartbeat  . A bad rash all over body  . Dizziness and weakness   Immunizations Administered    Name Date Dose VIS Date Route   Pfizer COVID-19 Vaccine 04/26/2019 11:46 AM 0.3 mL 01/17/2019 Intramuscular   Manufacturer: [employer], [employer]   Lot: [id number]   NDC: [address]-6

## 2019-04-30 ENCOUNTER — Ambulatory Visit: Payer: Self-pay

## 2019-05-20 ENCOUNTER — Encounter: Payer: Self-pay | Admitting: Family Medicine

## 2019-05-20 DIAGNOSIS — E785 Hyperlipidemia, unspecified: Secondary | ICD-10-CM

## 2019-05-20 MED ORDER — ATORVASTATIN CALCIUM 40 MG PO TABS: ORAL_TABLET | ORAL | 3 refills | Status: DC

## 2019-07-10 ENCOUNTER — Telehealth: Payer: Self-pay | Admitting: Family Medicine

## 2019-07-10 DIAGNOSIS — I10 Essential (primary) hypertension: Secondary | ICD-10-CM

## 2019-07-10 MED ORDER — METOPROLOL SUCCINATE ER 100 MG PO TB24: 100.0000 mg | ORAL_TABLET | Freq: Every day | ORAL | 0 refills | Status: DC

## 2019-07-10 NOTE — Telephone Encounter (Signed)
Medication: metoprolol succinate (TOPROL-XL) 100 MG 24 hr tablet  Has the patient contacted their pharmacy? No. (If no, request that the patient contact the pharmacy for the refill.) (If yes, when and what did the pharmacy advise?)  Preferred Pharmacy (with phone number or street name): [facility] [phone] - [place], [place] - [address] AT [facility] [address]  [address] [place] [address]  Phone:  [phone] Fax:  [phone]  DEA #:  [id number]  Agent: Please be advised that RX refills may take up to 3 business days. We ask that you follow-up with your pharmacy.   Requesting 30days supply until appointment on June 9,2021

## 2019-07-11 ENCOUNTER — Encounter: Payer: Self-pay | Admitting: Family Medicine

## 2019-07-15 ENCOUNTER — Encounter: Payer: Self-pay | Admitting: Family Medicine

## 2019-07-15 NOTE — Progress Notes (Deleted)
[place] at [facility] [address] [place], [place] [address] [phone]  Date:  07/16/2019   Name:  Kristen Scott   DOB:  [birth year]   MRN:  [id number]  PCP:  Darreld Mclean, MD    Chief Complaint: No chief complaint on file.   History of Present Illness:  Kristen Scott is a 64 y.o. 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  Cymbalta  COVID-19 series is complete Can start pneumonia series Bone density scan Mammogram July 2020 Pap can be updated Most recent labs 1 year ago  Patient Active Problem List   Diagnosis Date Noted  . Carpal tunnel syndrome 09/30/2015  . Pre-diabetes 10/05/2014  . GERD (gastroesophageal reflux disease) 03/05/2014  . Vitamin D deficiency 08/20/2011  . Hyperlipidemia 08/20/2011  . HTN (hypertension) 08/20/2011  . Obesity, Class II, BMI 35-39.9 08/20/2011  . Chronic pain disorder 08/20/2011  . Depression 08/20/2011  . DJD (degenerative joint disease) of knee 08/20/2011  . Arthritis, low back 03/30/2011  . Arthritis of neck ([place]) 03/30/2011    Past Medical History:  Diagnosis Date  . Arthritis   . Depression   . DJD (degenerative joint disease)    left knee  . GERD (gastroesophageal reflux disease)   . Hearing loss, sensorineural   . HTN (hypertension)   . Hyperlipidemia   . Migraine   . Osteopenia     Past Surgical History:  Procedure Laterality Date  . BREAST BIOPSY Right 05/25/2016  . JOINT REPLACEMENT  2008   left knee    Social History   Tobacco Use  . Smoking status: Never Smoker  . Smokeless tobacco: Never Used   Substance Use Topics  . Alcohol use: No  . Drug use: No    Family History  Problem Relation Age of Onset  . Dementia Mother   . Diabetes Father   . Diabetes Brother   . Mental illness Daughter        anxiety, OCD  . Diabetes Sister   . Diabetes Brother   . Heart attack Maternal Grandfather     Allergies  Allergen Reactions  . Relafen [Nabumetone] Rash    Medication list has been reviewed and updated.  Current Outpatient Medications on File Prior to Visit  Medication Sig Dispense Refill  . atorvastatin (LIPITOR) 40 MG tablet TAKE 1 TABLET BY MOUTH ONCE DAILY 90 tablet 3  . DULoxetine (CYMBALTA) 30 MG capsule Take 90 mg by mouth daily.    Marland Kitchen HYDROcodone-acetaminophen (NORCO) 10-325 MG per tablet Take 1 tablet by mouth 2 (two) times daily.    . metFORMIN (GLUCOPHAGE) 500 MG tablet Take 1 tablet (500 mg total) by mouth daily. 90 tablet 3  . metoprolol succinate (TOPROL-XL) 100 MG 24 hr tablet Take 1 tablet (100 mg total) by mouth daily. Take with or immediately following a meal. 30 tablet 0  . omeprazole (PRILOSEC) 20 MG capsule Take 20 mg by mouth daily.     No current facility-administered medications on file prior to visit.    Review of Systems:  As per HPI- otherwise negative.   Physical Examination:  There were no vitals filed for this visit. There were no vitals filed for this visit. There is no height or weight on file to calculate BMI. Ideal Body Weight:    GEN: no acute distress. HEENT: Atraumatic, Normocephalic.  Ears and Nose: No external deformity. CV: RRR, No M/G/R. No JVD. No thrill. No extra heart sounds. PULM: CTA B, no wheezes, crackles, rhonchi. No retractions. No resp. distress. No accessory muscle use. ABD: S, NT, ND, +BS. No rebound. No HSM. EXTR: No c/c/e PSYCH: Normally interactive. Conversant.    Assessment and Plan: *** This visit occurred during the SARS-CoV-2 public health emergency.  Safety protocols were in place, including screening  questions prior to the visit, additional usage of staff PPE, and extensive cleaning of exam room while observing appropriate contact time as indicated for disinfecting solutions.    Signed Abbe Amsterdam, MD

## 2019-07-16 ENCOUNTER — Ambulatory Visit: Payer: Self-pay | Admitting: Family Medicine

## 2019-07-28 NOTE — Patient Instructions (Addendum)
It was great to see you again today, I will be in touch with your labs as soon as possible You got your first of 2 pneumonia vaccines today- booster in one year Please also consider getting the shingles vaccine- Shingirx- at your pharmacy at your convenience  I ordered your bone density and mammogram today- to be done at the [facility]

## 2019-07-28 NOTE — Progress Notes (Addendum)
[facility] at [facility] [address] [place], [place] [address] [phone]  Date:  07/30/2019   Name:  Kristen Scott   DOB:  05-09-1955   MRN:  [id number]  PCP:  Pearline Cables, MD    Chief Complaint: 6 month follow up   History of Present Illness:  Kristen Scott is a 64 y.o. 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  She has 2 young grandchildren ages 29 and 20 Dr. Ethelene Hal manages her chronic back pain with hydrocodone-she receives 90 hydrocodone 10/month Due for labs today Mammogram due in July; will do at breast center [place]  Colon cancer screen up-to-date Covid series complete Can suggest Shingrix DEXA scan- order to do with mammo  Start prevnar today  She is trying to exercise but eating right is hard -she has a hard time losing weight  Her blood pressure seems to be under okay control, no symptoms of high or low blood pressure Patient Active Problem List   Diagnosis Date Noted  . Carpal tunnel syndrome 09/30/2015  . Pre-diabetes 10/05/2014  . GERD (gastroesophageal reflux disease) 03/05/2014  . Vitamin D deficiency 08/20/2011  . Hyperlipidemia 08/20/2011  . HTN (hypertension) 08/20/2011  . Obesity, Class II, BMI 35-39.9 08/20/2011  . Chronic pain disorder 08/20/2011  . Depression 08/20/2011  . DJD (degenerative joint disease) of knee 08/20/2011  . Arthritis, low back 03/30/2011  . Arthritis of neck ([facility]) 03/30/2011    Past Medical History:  Diagnosis Date  . Arthritis   . Depression   . DJD (degenerative joint disease)    left knee  . GERD (gastroesophageal reflux disease)   . Hearing loss, sensorineural   . HTN (hypertension)   .  Hyperlipidemia   . Migraine   . Osteopenia     Past Surgical History:  Procedure Laterality Date  . BREAST BIOPSY Right 05/25/2016  . JOINT REPLACEMENT  2008   left knee    Social History   Tobacco Use  . Smoking status: Never Smoker  . Smokeless tobacco: Never Used  Substance Use Topics  . Alcohol use: No  . Drug use: No    Family History  Problem Relation Age of Onset  . Dementia Mother   . Diabetes Father   . Diabetes Brother   . Mental illness Daughter        anxiety, OCD  . Diabetes Sister   . Diabetes Brother   . Heart attack Maternal Grandfather     Allergies  Allergen Reactions  . Relafen [Nabumetone] Rash    Medication list has been reviewed and updated.  Current Outpatient Medications on File Prior to Visit  Medication Sig Dispense Refill  . atorvastatin (LIPITOR) 40 MG tablet TAKE 1 TABLET BY MOUTH ONCE DAILY 90 tablet 3  . DULoxetine (CYMBALTA) 30 MG capsule Take 90 mg by mouth daily.    Marland Kitchen HYDROcodone-acetaminophen (NORCO) 10-325 MG per tablet Take 1 tablet by mouth 2 (two) times daily.    . metFORMIN (GLUCOPHAGE) 500 MG tablet Take 1 tablet (500 mg total) by mouth daily. 90 tablet 3  . metoprolol succinate (TOPROL-XL) 100 MG 24 hr tablet Take 1 tablet (100 mg total) by mouth daily. Take with  or immediately following a meal. 30 tablet 0  . omeprazole (PRILOSEC) 20 MG capsule Take 20 mg by mouth daily.     No current facility-administered medications on file prior to visit.    Review of Systems:  As per HPI- otherwise negative.   Physical Examination: Vitals:   07/30/19 1531  BP: 122/76  Pulse: 89  Resp: 12  Temp: 97.7 F (36.5 C)  SpO2: 94%   Vitals:   07/30/19 1531  Weight: 216 lb 9.6 oz (98.2 kg)  Height: 5\' 2"  (1.575 m)   Body mass index is 39.62 kg/m. Ideal Body Weight: Weight in (lb) to have BMI = 25: 136.4  GEN: no acute distress.  Obese, otherwise looks well  HEENT: Atraumatic, Normocephalic.  Ears and Nose: No external  deformity. CV: RRR, No M/G/R. No JVD. No thrill. No extra heart sounds. PULM: CTA B, no wheezes, crackles, rhonchi. No retractions. No resp. distress. No accessory muscle use. ABD: S, NT, ND. No rebound. No HSM. EXTR: No c/c/e PSYCH: Normally interactive. Conversant.    Assessment and Plan: Essential hypertension - Plan: CBC, Comprehensive metabolic panel, metoprolol succinate (TOPROL-XL) 100 MG 24 hr tablet  Hyperlipidemia, unspecified hyperlipidemia type - Plan: Lipid panel  Pre-diabetes - Plan: Comprehensive metabolic panel, Hemoglobin A1c, metFORMIN (GLUCOPHAGE) 500 MG tablet  Screening for thyroid disorder - Plan: TSH  Immunization due - Plan: Pneumococcal conjugate vaccine 13-valent IM  Estrogen deficiency - Plan: DG Bone Density  Encounter for screening mammogram for malignant neoplasm of breast - Plan: MM 3D SCREEN BREAST BILATERAL   Patient here today for follow-up of chronic health problems.  Labs are pending as above I ordered mammogram and bone density scan to be done at the breast center this fall Blood pressure is currently under good control, refill her metoprolol Started pneumonia series today, encouraged her to get Shingrix vaccine at her pharmacy Will plan further follow- up pending labs. Assuming labs are okay plan to visit again in 6 months This visit occurred during the SARS-CoV-2 public health emergency.  Safety protocols were in place, including screening questions prior to the visit, additional usage of staff PPE, and extensive cleaning of exam room while observing appropriate contact time as indicated for disinfecting solutions.    Signed , MD  addnd 6/25- received her labs as below Message to pt A1c stable Results for orders placed or performed in visit on 07/30/19  CBC  Result Value Ref Range   WBC 7.4 4.0 - 10.5 K/uL   RBC 4.27 3.87 - 5.11 Mil/uL   Platelets 290.0 150 - 400 K/uL   Hemoglobin 11.8 (L) 12.0 - 15.0 g/dL   HCT 08/01/19  (L) 36 - 46 %   MCV 83.4 78.0 - 100.0 fl   MCHC 33.1 30.0 - 36.0 g/dL   RDW 80.2 23.3 - 61.2 %  Comprehensive metabolic panel  Result Value Ref Range   Sodium 137 135 - 145 mEq/L   Potassium 4.0 3.5 - 5.1 mEq/L   Chloride 105 96 - 112 mEq/L   CO2 26 19 - 32 mEq/L   Glucose, Bld 97 70 - 99 mg/dL   BUN 27 (H) 6 - 23 mg/dL   Creatinine, Ser 24.4 0.40 - 1.20 mg/dL   Total Bilirubin 0.4 0.2 - 1.2 mg/dL   Alkaline Phosphatase 85 39 - 117 U/L   AST 19 0 - 37 U/L   ALT 18 0 - 35 U/L   Total Protein 7.3 6.0 - 8.3 g/dL  Albumin 4.3 3.5 - 5.2 g/dL   GFR 79.78 >60.00 mL/min   Calcium 9.5 8.4 - 10.5 mg/dL  Hemoglobin A1c  Result Value Ref Range   Hgb A1c MFr Bld 6.2 4.6 - 6.5 %  Lipid panel  Result Value Ref Range   Cholesterol 162 0 - 200 mg/dL   Triglycerides 248.0 (H) 0 - 149 mg/dL   HDL 46.30 >39.00 mg/dL   VLDL 49.6 (H) 0.0 - 40.0 mg/dL   Total CHOL/HDL Ratio 4    NonHDL 116.05   TSH  Result Value Ref Range   TSH 1.14 0.35 - 4.50 uIU/mL  LDL cholesterol, direct  Result Value Ref Range   Direct LDL 81.0 mg/dL

## 2019-07-30 ENCOUNTER — Other Ambulatory Visit: Payer: Self-pay

## 2019-07-30 ENCOUNTER — Ambulatory Visit (INDEPENDENT_AMBULATORY_CARE_PROVIDER_SITE_OTHER): Payer: Medicare PPO | Admitting: Family Medicine

## 2019-07-30 ENCOUNTER — Encounter: Payer: Self-pay | Admitting: Family Medicine

## 2019-07-30 VITALS — BP 122/76 | HR 89 | Temp 97.7°F | Resp 12 | Ht 62.0 in | Wt 216.6 lb

## 2019-07-30 DIAGNOSIS — Z1329 Encounter for screening for other suspected endocrine disorder: Secondary | ICD-10-CM | POA: Diagnosis not present

## 2019-07-30 DIAGNOSIS — I10 Essential (primary) hypertension: Secondary | ICD-10-CM

## 2019-07-30 DIAGNOSIS — Z23 Encounter for immunization: Secondary | ICD-10-CM | POA: Diagnosis not present

## 2019-07-30 DIAGNOSIS — E2839 Other primary ovarian failure: Secondary | ICD-10-CM

## 2019-07-30 DIAGNOSIS — R7303 Prediabetes: Secondary | ICD-10-CM | POA: Diagnosis not present

## 2019-07-30 DIAGNOSIS — E785 Hyperlipidemia, unspecified: Secondary | ICD-10-CM | POA: Diagnosis not present

## 2019-07-30 DIAGNOSIS — Z1231 Encounter for screening mammogram for malignant neoplasm of breast: Secondary | ICD-10-CM

## 2019-07-30 MED ORDER — METOPROLOL SUCCINATE ER 100 MG PO TB24: 100.0000 mg | ORAL_TABLET | Freq: Every day | ORAL | 3 refills | Status: DC

## 2019-07-30 MED ORDER — METFORMIN HCL 500 MG PO TABS: 500.0000 mg | ORAL_TABLET | Freq: Every day | ORAL | 3 refills | Status: DC

## 2019-07-31 LAB — COMPREHENSIVE METABOLIC PANEL
ALT: 18 U/L (ref 0–35)
AST: 19 U/L (ref 0–37)
Albumin: 4.3 g/dL (ref 3.5–5.2)
Alkaline Phosphatase: 85 U/L (ref 39–117)
BUN: 27 mg/dL — ABNORMAL HIGH (ref 6–23)
CO2: 26 mEq/L (ref 19–32)
Calcium: 9.5 mg/dL (ref 8.4–10.5)
Chloride: 105 mEq/L (ref 96–112)
Creatinine, Ser: 0.73 mg/dL (ref 0.40–1.20)
GFR: 79.78 mL/min (ref >60.00)
Glucose, Bld: 97 mg/dL (ref 70–99)
Potassium: 4 mEq/L (ref 3.5–5.1)
Sodium: 137 mEq/L (ref 135–145)
Total Bilirubin: 0.4 mg/dL (ref 0.2–1.2)
Total Protein: 7.3 g/dL (ref 6.0–8.3)

## 2019-07-31 LAB — CBC
HCT: 35.6 % — ABNORMAL LOW (ref 36.0–46.0)
Hemoglobin: 11.8 g/dL — ABNORMAL LOW (ref 12.0–15.0)
MCHC: 33.1 g/dL (ref 30.0–36.0)
MCV: 83.4 fl (ref 78.0–100.0)
Platelets: 290 10*3/uL (ref 150.0–400.0)
RBC: 4.27 Mil/uL (ref 3.87–5.11)
RDW: 14.3 % (ref 11.5–15.5)
WBC: 7.4 10*3/uL (ref 4.0–10.5)

## 2019-07-31 LAB — LIPID PANEL
Cholesterol: 162 mg/dL (ref 0–200)
HDL: 46.3 mg/dL (ref >39.00)
NonHDL: 116.05
Total CHOL/HDL Ratio: 4
Triglycerides: 248 mg/dL — ABNORMAL HIGH (ref 0.0–149.0)
VLDL: 49.6 mg/dL — ABNORMAL HIGH (ref 0.0–40.0)

## 2019-07-31 LAB — TSH: TSH: 1.14 u[IU]/mL (ref 0.35–4.50)

## 2019-07-31 LAB — HEMOGLOBIN A1C: Hgb A1c MFr Bld: 6.2 % (ref 4.6–6.5)

## 2019-07-31 LAB — LDL CHOLESTEROL, DIRECT: Direct LDL: 81 mg/dL

## 2019-08-01 ENCOUNTER — Encounter: Payer: Self-pay | Admitting: Family Medicine

## 2019-10-20 DIAGNOSIS — M5030 Other cervical disc degeneration, unspecified cervical region: Secondary | ICD-10-CM | POA: Diagnosis not present

## 2019-10-20 DIAGNOSIS — M5136 Other intervertebral disc degeneration, lumbar region: Secondary | ICD-10-CM | POA: Diagnosis not present

## 2019-10-20 DIAGNOSIS — Z79891 Long term (current) use of opiate analgesic: Secondary | ICD-10-CM | POA: Diagnosis not present

## 2019-12-08 ENCOUNTER — Other Ambulatory Visit: Payer: Self-pay

## 2019-12-08 ENCOUNTER — Ambulatory Visit
Admission: RE | Admit: 2019-12-08 | Discharge: 2019-12-08 | Disposition: A | Discharge status: Home / Self Care | Payer: Medicare PPO | Source: Ambulatory Visit | Attending: Family Medicine | Admitting: Family Medicine

## 2019-12-08 ENCOUNTER — Encounter: Payer: Self-pay | Admitting: Family Medicine

## 2019-12-08 DIAGNOSIS — M8589 Other specified disorders of bone density and structure, multiple sites: Secondary | ICD-10-CM | POA: Diagnosis not present

## 2019-12-08 DIAGNOSIS — E2839 Other primary ovarian failure: Secondary | ICD-10-CM

## 2019-12-08 DIAGNOSIS — Z1231 Encounter for screening mammogram for malignant neoplasm of breast: Secondary | ICD-10-CM

## 2019-12-08 DIAGNOSIS — M8580 Other specified disorders of bone density and structure, unspecified site: Secondary | ICD-10-CM | POA: Insufficient documentation

## 2019-12-08 DIAGNOSIS — Z780 Asymptomatic menopausal state: Secondary | ICD-10-CM | POA: Diagnosis not present

## 2020-02-09 DIAGNOSIS — G894 Chronic pain syndrome: Secondary | ICD-10-CM | POA: Diagnosis not present

## 2020-02-18 DIAGNOSIS — F411 Generalized anxiety disorder: Secondary | ICD-10-CM | POA: Diagnosis not present

## 2020-03-27 NOTE — Patient Instructions (Incomplete)
It was good to see you again today, I will be in touch with your labs as soon as possible ?

## 2020-03-27 NOTE — Progress Notes (Deleted)
[facility] at [facility] [address] [place], [place] [address] [phone]  Date:  03/29/2020   Name:  Kristen Scott   DOB:  Jun 10, 1955   MRN:  [id number]  PCP:  Pearline Cables, MD    Chief Complaint: No chief complaint on file.   History of Present Illness:  Kristen Scott is a 65 y.o. 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  She has 2 young grandchildren ages 60 and 45 Dr. Ethelene Hal manages her chronic back pain with hydrocodone-she receives 90 hydrocodone 10/month  Flu vaccine  COVID-19 booster  colon cancer screening is up-to-date Mammogram up-to-date Most recent labs in June Mammogram up-to-date DEXA scan up-to-date Shingrix  Lipitor Cymbalta Metformin Metoprolol Vicodin  Patient Active Problem List   Diagnosis Date Noted  . Osteopenia 12/08/2019  . Carpal tunnel syndrome 09/30/2015  . Pre-diabetes 10/05/2014  . GERD (gastroesophageal reflux disease) 03/05/2014  . Vitamin D deficiency 08/20/2011  . Hyperlipidemia 08/20/2011  . HTN (hypertension) 08/20/2011  . Obesity, Class II, BMI 35-39.9 08/20/2011  . Chronic pain disorder 08/20/2011  . Depression 08/20/2011  . DJD (degenerative joint disease) of knee 08/20/2011  . Arthritis, low back 03/30/2011  . Arthritis of neck ([facility]) 03/30/2011    Past Medical History:  Diagnosis Date  . Arthritis   . Depression   . DJD (degenerative joint disease)    left knee  . GERD (gastroesophageal reflux disease)   . Hearing loss, sensorineural   . HTN (hypertension)   . Hyperlipidemia   . Migraine   . Osteopenia     Past Surgical History:  Procedure Laterality Date  . BREAST BIOPSY Right 05/25/2016  . JOINT REPLACEMENT  2008    left knee    Social History   Tobacco Use  . Smoking status: Never Smoker  . Smokeless tobacco: Never Used  Substance Use Topics  . Alcohol use: No  . Drug use: No    Family History  Problem Relation Age of Onset  . Dementia Mother   . Diabetes Father   . Diabetes Brother   . Mental illness Daughter        anxiety, OCD  . Diabetes Sister   . Diabetes Brother   . Heart attack Maternal Grandfather     Allergies  Allergen Reactions  . Relafen [Nabumetone] Rash    Medication list has been reviewed and updated.  Current Outpatient Medications on File Prior to Visit  Medication Sig Dispense Refill  . atorvastatin (LIPITOR) 40 MG tablet TAKE 1 TABLET BY MOUTH ONCE DAILY 90 tablet 3  . DULoxetine (CYMBALTA) 30 MG capsule Take 90 mg by mouth daily.    Marland Kitchen HYDROcodone-acetaminophen (NORCO) 10-325 MG per tablet Take 1 tablet by mouth 2 (two) times daily.    . metFORMIN (GLUCOPHAGE) 500 MG tablet Take 1 tablet (500 mg total) by mouth daily. 90 tablet 3  . metoprolol succinate (TOPROL-XL) 100 MG 24 hr tablet Take 1 tablet (100 mg total) by mouth daily. Take with or immediately following a meal. 90 tablet 3  . omeprazole (PRILOSEC) 20 MG capsule Take 20 mg by mouth daily.     No current facility-administered medications on file prior to visit.    Review of Systems:  As  per HPI- otherwise negative.   Physical Examination: There were no vitals filed for this visit. There were no vitals filed for this visit. There is no height or weight on file to calculate BMI. Ideal Body Weight:    GEN: no acute distress. HEENT: Atraumatic, Normocephalic.  Ears and Nose: No external deformity. CV: RRR, No M/G/R. No JVD. No thrill. No extra heart sounds. PULM: CTA B, no wheezes, crackles, rhonchi. No retractions. No resp. distress. No accessory muscle use. ABD: S, NT, ND, +BS. No rebound. No HSM. EXTR: No c/c/e PSYCH: Normally interactive. Conversant.    Assessment and Plan: *** This  visit occurred during the SARS-CoV-2 public health emergency.  Safety protocols were in place, including screening questions prior to the visit, additional usage of staff PPE, and extensive cleaning of exam room while observing appropriate contact time as indicated for disinfecting solutions.    Signed Abbe Amsterdam, MD

## 2020-03-29 ENCOUNTER — Ambulatory Visit: Payer: Medicare PPO | Admitting: Family Medicine

## 2020-03-29 DIAGNOSIS — Z1329 Encounter for screening for other suspected endocrine disorder: Secondary | ICD-10-CM

## 2020-03-29 DIAGNOSIS — E785 Hyperlipidemia, unspecified: Secondary | ICD-10-CM

## 2020-03-29 DIAGNOSIS — R7303 Prediabetes: Secondary | ICD-10-CM

## 2020-03-29 DIAGNOSIS — I10 Essential (primary) hypertension: Secondary | ICD-10-CM

## 2020-03-29 DIAGNOSIS — R5383 Other fatigue: Secondary | ICD-10-CM

## 2020-05-10 ENCOUNTER — Other Ambulatory Visit: Payer: Self-pay | Admitting: Family Medicine

## 2020-05-10 DIAGNOSIS — E785 Hyperlipidemia, unspecified: Secondary | ICD-10-CM

## 2020-06-04 DIAGNOSIS — G894 Chronic pain syndrome: Secondary | ICD-10-CM | POA: Diagnosis not present

## 2020-06-04 DIAGNOSIS — M5030 Other cervical disc degeneration, unspecified cervical region: Secondary | ICD-10-CM | POA: Diagnosis not present

## 2020-06-04 DIAGNOSIS — M5136 Other intervertebral disc degeneration, lumbar region: Secondary | ICD-10-CM | POA: Diagnosis not present

## 2020-06-04 DIAGNOSIS — Z5181 Encounter for therapeutic drug level monitoring: Secondary | ICD-10-CM | POA: Diagnosis not present

## 2020-06-04 DIAGNOSIS — M542 Cervicalgia: Secondary | ICD-10-CM | POA: Diagnosis not present

## 2020-06-04 DIAGNOSIS — Z79899 Other long term (current) drug therapy: Secondary | ICD-10-CM | POA: Diagnosis not present

## 2020-07-03 ENCOUNTER — Other Ambulatory Visit: Payer: Self-pay | Admitting: Family Medicine

## 2020-07-03 DIAGNOSIS — E785 Hyperlipidemia, unspecified: Secondary | ICD-10-CM

## 2020-07-19 ENCOUNTER — Other Ambulatory Visit: Payer: Self-pay | Admitting: Family Medicine

## 2020-07-19 DIAGNOSIS — R7303 Prediabetes: Secondary | ICD-10-CM

## 2020-07-21 DIAGNOSIS — Z86010 Personal history of colonic polyps: Secondary | ICD-10-CM | POA: Diagnosis not present

## 2020-07-21 DIAGNOSIS — K219 Gastro-esophageal reflux disease without esophagitis: Secondary | ICD-10-CM | POA: Diagnosis not present

## 2020-07-24 NOTE — Patient Instructions (Addendum)
Good to see you again today!  Great job with weight loss so far- keep up the excellent work!  I will be in touch with your labs and pap asap

## 2020-07-24 NOTE — Progress Notes (Addendum)
[facility] at [facility] [address] [place], [place] [address] [phone]  Date:  07/28/2020   Name:  Kristen Scott   DOB:  10-18-1955   MRN:  [id number]  PCP:  Kristen Cables, MD    Chief Complaint: Hypertension (6 month follow up)   History of Present Illness:  Kristen Scott is a 66 y.o. 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 Dr Ethelene Hal manages her chronic back pain with hydrocodone- she continues to have a lot of issues with stiffness and aches  She has lost about 10 lbs since our last visit with diet change -congratulated her on her efforts-   Wt Readings from Last 3 Encounters:  07/28/20 207 lb (93.9 kg)  07/30/19 216 lb 9.6 oz (98.2 kg)  10/17/17 217 lb (98.4 kg)   Mammo- UTD  Dexa UTD Colon- UTD but update is due soon  Covid booster- done last month but she does not have her card Shingrix-not done yet  Her Prilosec being managed by GI She is using cymbalta per her therapist with good results   She last had a pap about 5 years ago She did have abnormal Pap in the past, but this was 30+ years ago  We will update her Pap smear today   Patient Active Problem List   Diagnosis Date Noted   Osteopenia 12/08/2019   Carpal tunnel syndrome 09/30/2015   Pre-diabetes 10/05/2014   GERD (gastroesophageal reflux disease) 03/05/2014   Vitamin D deficiency 08/20/2011   Hyperlipidemia 08/20/2011   HTN (hypertension) 08/20/2011   Obesity, Class II, BMI 35-39.9 08/20/2011   Chronic pain disorder 08/20/2011   Depression 08/20/2011   DJD (degenerative joint disease) of knee 08/20/2011   Arthritis, low back  03/30/2011   Arthritis of neck ([facility]) 03/30/2011    Past Medical History:  Diagnosis Date   Arthritis    Depression    DJD (degenerative joint disease)    left knee   GERD (gastroesophageal reflux disease)    Hearing loss, sensorineural    HTN (hypertension)    Hyperlipidemia    Migraine    Osteopenia     Past Surgical History:  Procedure Laterality Date   BREAST BIOPSY Right 05/25/2016   JOINT REPLACEMENT  2008   left knee    Social History   Tobacco Use   Smoking status: Never   Smokeless tobacco: Never  Substance Use Topics   Alcohol use: No   Drug use: No    Family History  Problem Relation Age of Onset   Dementia Mother    Diabetes Father    Diabetes Brother    Mental illness Daughter        anxiety, OCD   Diabetes Sister    Diabetes Brother    Heart attack Maternal Grandfather     Allergies  Allergen Reactions   Relafen [Nabumetone] Rash    Medication list has been reviewed and updated.  Current Outpatient Medications on File Prior to Visit  Medication Sig Dispense Refill   atorvastatin (LIPITOR) 40 MG tablet TAKE 1 TABLET(40 MG) BY MOUTH DAILY 15 tablet 0   DULoxetine (CYMBALTA)  30 MG capsule Take 90 mg by mouth daily.     HYDROcodone-acetaminophen (NORCO) 10-325 MG per tablet Take 1 tablet by mouth 2 (two) times daily.     metFORMIN (GLUCOPHAGE) 500 MG tablet TAKE 1 TABLET(500 MG) BY MOUTH DAILY 90 tablet 1   metoprolol succinate (TOPROL-XL) 100 MG 24 hr tablet Take 1 tablet (100 mg total) by mouth daily. Take with or immediately following a meal. 90 tablet 3   omeprazole (PRILOSEC) 20 MG capsule Take 20 mg by mouth daily.     No current facility-administered medications on file prior to visit.    Review of Systems:  As per HPI- otherwise negative.   Physical Examination: Vitals:   07/28/20 0910  BP: 116/82  Pulse: 65  Resp: 16  Temp: (!) 97.3 F (36.3 C)  SpO2: 98%   Vitals:   07/28/20 0910  Weight: 207 lb (93.9 kg)  Height:  5\' 2"  (1.575 m)   Body mass index is 37.86 kg/m. Ideal Body Weight: Weight in (lb) to have BMI = 25: 136.4  GEN: no acute distress.  Obese, looks well  PEERL, EOMI  HEENT: Atraumatic, Normocephalic.  Ears and Nose: No external deformity. CV: RRR, No M/G/R. No JVD. No thrill. No extra heart sounds. PULM: CTA B, no wheezes, crackles, rhonchi. No retractions. No resp. distress. No accessory muscle use. ABD: S, NT, ND, +BS. No rebound. No HSM. EXTR: No c/c/e PSYCH: Normally interactive. Conversant.  Pap collected today - normal post menopausal exam    Assessment and Plan: Physical exam  Essential hypertension - Plan: CBC, Comprehensive metabolic panel, metoprolol succinate (TOPROL-XL) 100 MG 24 hr tablet  Hyperlipidemia, unspecified hyperlipidemia type - Plan: Lipid panel, atorvastatin (LIPITOR) 40 MG tablet  Pre-diabetes - Plan: Comprehensive metabolic panel, Hemoglobin A1c, metFORMIN (GLUCOPHAGE) 500 MG tablet  Screening for thyroid disorder - Plan: TSH  Encounter for screening mammogram for malignant neoplasm of breast  Fatigue, unspecified type - Plan: TSH, VITAMIN D 25 Hydroxy (Vit-D Deficiency, Fractures)  Screening for cervical cancer - Plan: Cytology - PAP   CPE today Encouraged healthy diet and exercise routine Pap today-advised patient that assuming this is normal she can dispense with Pap screening if she likes Order mammogram Blood pressure under good control on current medication  This visit occurred during the SARS-CoV-2 public health emergency.  Safety protocols were in place, including screening questions prior to the visit, additional usage of staff PPE, and extensive cleaning of exam room while observing appropriate contact time as indicated for disinfecting solutions.   Signed , MD  Received her labs as below, message to pt  Results for orders placed or performed in visit on 07/28/20  CBC  Result Value Ref Range   WBC 5.0 4.0 - 10.5  K/uL   RBC 4.43 3.87 - 5.11 Mil/uL   Platelets 284.0 150.0 - 400.0 K/uL   Hemoglobin 12.1 12.0 - 15.0 g/dL   HCT 07/30/20 96.2 - 95.2 %   MCV 81.5 78.0 - 100.0 fl   MCHC 33.5 30.0 - 36.0 g/dL   RDW 84.1 32.4 - 40.1 %  Comprehensive metabolic panel  Result Value Ref Range   Sodium 139 135 - 145 mEq/L   Potassium 4.4 3.5 - 5.1 mEq/L   Chloride 104 96 - 112 mEq/L   CO2 27 19 - 32 mEq/L   Glucose, Bld 115 (H) 70 - 99 mg/dL   BUN 28 (H) 6 - 23 mg/dL   Creatinine, Ser 02.7 0.40 - 1.20  mg/dL   Total Bilirubin 0.5 0.2 - 1.2 mg/dL   Alkaline Phosphatase 93 39 - 117 U/L   AST 18 0 - 37 U/L   ALT 17 0 - 35 U/L   Total Protein 7.4 6.0 - 8.3 g/dL   Albumin 4.3 3.5 - 5.2 g/dL   GFR 99.83 >38.25 mL/min   Calcium 9.6 8.4 - 10.5 mg/dL  Hemoglobin K5L  Result Value Ref Range   Hgb A1c MFr Bld 6.3 4.6 - 6.5 %  Lipid panel  Result Value Ref Range   Cholesterol 154 0 - 200 mg/dL   Triglycerides 976.7 0.0 - 149.0 mg/dL   HDL 34.19 >37.90 mg/dL   VLDL 24.0 0.0 - 97.3 mg/dL   LDL Cholesterol 81 0 - 99 mg/dL   Total CHOL/HDL Ratio 3    NonHDL 109.95   TSH  Result Value Ref Range   TSH 1.22 0.35 - 4.50 uIU/mL  VITAMIN D 25 Hydroxy (Vit-D Deficiency, Fractures)  Result Value Ref Range   VITD 25.95 (L) 30.00 - 100.00 ng/mL

## 2020-07-28 ENCOUNTER — Encounter: Payer: Self-pay | Admitting: Family Medicine

## 2020-07-28 ENCOUNTER — Other Ambulatory Visit: Payer: Self-pay

## 2020-07-28 ENCOUNTER — Ambulatory Visit: Payer: Medicare PPO | Admitting: Family Medicine

## 2020-07-28 ENCOUNTER — Other Ambulatory Visit (HOSPITAL_COMMUNITY)
Admission: RE | Admit: 2020-07-28 | Discharge: 2020-07-28 | Disposition: A | Discharge status: Home / Self Care | Payer: Medicare PPO | Source: Ambulatory Visit | Attending: Family Medicine | Admitting: Family Medicine

## 2020-07-28 VITALS — BP 116/82 | HR 65 | Temp 97.3°F | Resp 16 | Ht 62.0 in | Wt 207.0 lb

## 2020-07-28 DIAGNOSIS — I10 Essential (primary) hypertension: Secondary | ICD-10-CM | POA: Diagnosis not present

## 2020-07-28 DIAGNOSIS — Z1231 Encounter for screening mammogram for malignant neoplasm of breast: Secondary | ICD-10-CM | POA: Diagnosis not present

## 2020-07-28 DIAGNOSIS — Z0000 Encounter for general adult medical examination without abnormal findings: Secondary | ICD-10-CM

## 2020-07-28 DIAGNOSIS — Z1151 Encounter for screening for human papillomavirus (HPV): Secondary | ICD-10-CM | POA: Insufficient documentation

## 2020-07-28 DIAGNOSIS — Z124 Encounter for screening for malignant neoplasm of cervix: Secondary | ICD-10-CM | POA: Diagnosis not present

## 2020-07-28 DIAGNOSIS — R7303 Prediabetes: Secondary | ICD-10-CM

## 2020-07-28 DIAGNOSIS — Z1329 Encounter for screening for other suspected endocrine disorder: Secondary | ICD-10-CM

## 2020-07-28 DIAGNOSIS — Z01419 Encounter for gynecological examination (general) (routine) without abnormal findings: Secondary | ICD-10-CM | POA: Diagnosis not present

## 2020-07-28 DIAGNOSIS — R799 Abnormal finding of blood chemistry, unspecified: Secondary | ICD-10-CM

## 2020-07-28 DIAGNOSIS — E785 Hyperlipidemia, unspecified: Secondary | ICD-10-CM | POA: Diagnosis not present

## 2020-07-28 DIAGNOSIS — R5383 Other fatigue: Secondary | ICD-10-CM

## 2020-07-28 LAB — COMPREHENSIVE METABOLIC PANEL
ALT: 17 U/L (ref 0–35)
AST: 18 U/L (ref 0–37)
Albumin: 4.3 g/dL (ref 3.5–5.2)
Alkaline Phosphatase: 93 U/L (ref 39–117)
BUN: 28 mg/dL — ABNORMAL HIGH (ref 6–23)
CO2: 27 mEq/L (ref 19–32)
Calcium: 9.6 mg/dL (ref 8.4–10.5)
Chloride: 104 mEq/L (ref 96–112)
Creatinine, Ser: 0.83 mg/dL (ref 0.40–1.20)
GFR: 73.2 mL/min (ref >60.00)
Glucose, Bld: 115 mg/dL — ABNORMAL HIGH (ref 70–99)
Potassium: 4.4 mEq/L (ref 3.5–5.1)
Sodium: 139 mEq/L (ref 135–145)
Total Bilirubin: 0.5 mg/dL (ref 0.2–1.2)
Total Protein: 7.4 g/dL (ref 6.0–8.3)

## 2020-07-28 LAB — LIPID PANEL
Cholesterol: 154 mg/dL (ref 0–200)
HDL: 44.1 mg/dL (ref >39.00)
LDL Cholesterol: 81 mg/dL (ref 0–99)
NonHDL: 109.95
Total CHOL/HDL Ratio: 3
Triglycerides: 147 mg/dL (ref 0.0–149.0)
VLDL: 29.4 mg/dL (ref 0.0–40.0)

## 2020-07-28 LAB — VITAMIN D 25 HYDROXY (VIT D DEFICIENCY, FRACTURES): VITD: 25.95 ng/mL — ABNORMAL LOW (ref 30.00–100.00)

## 2020-07-28 LAB — CBC
HCT: 36.1 % (ref 36.0–46.0)
Hemoglobin: 12.1 g/dL (ref 12.0–15.0)
MCHC: 33.5 g/dL (ref 30.0–36.0)
MCV: 81.5 fl (ref 78.0–100.0)
Platelets: 284 10*3/uL (ref 150.0–400.0)
RBC: 4.43 Mil/uL (ref 3.87–5.11)
RDW: 14.2 % (ref 11.5–15.5)
WBC: 5 10*3/uL (ref 4.0–10.5)

## 2020-07-28 LAB — HEMOGLOBIN A1C: Hgb A1c MFr Bld: 6.3 % (ref 4.6–6.5)

## 2020-07-28 LAB — TSH: TSH: 1.22 u[IU]/mL (ref 0.35–4.50)

## 2020-07-28 MED ORDER — ATORVASTATIN CALCIUM 40 MG PO TABS: ORAL_TABLET | ORAL | 3 refills | Status: DC

## 2020-07-28 MED ORDER — METFORMIN HCL 500 MG PO TABS: ORAL_TABLET | ORAL | 3 refills | Status: DC

## 2020-07-28 MED ORDER — METOPROLOL SUCCINATE ER 100 MG PO TB24: 100.0000 mg | ORAL_TABLET | Freq: Every day | ORAL | 3 refills | Status: DC

## 2020-07-30 LAB — CYTOLOGY - PAP
Comment: NEGATIVE
Diagnosis: NEGATIVE
High risk HPV: NEGATIVE

## 2020-08-03 ENCOUNTER — Telehealth: Payer: Self-pay | Admitting: Family Medicine

## 2020-08-03 NOTE — Telephone Encounter (Signed)
Recd a call from [facility] that order does needs to be changed to  Renal Artery Duplex Complete.  She also stated she might send a message via Epic to you as well.

## 2020-08-16 ENCOUNTER — Encounter: Payer: Self-pay | Admitting: Family Medicine

## 2020-08-16 ENCOUNTER — Ambulatory Visit
Admission: RE | Admit: 2020-08-16 | Discharge: 2020-08-16 | Disposition: A | Discharge status: Home / Self Care | Payer: Medicare PPO | Source: Ambulatory Visit | Attending: Family Medicine | Admitting: Family Medicine

## 2020-08-16 DIAGNOSIS — R799 Abnormal finding of blood chemistry, unspecified: Secondary | ICD-10-CM

## 2020-09-08 DIAGNOSIS — F411 Generalized anxiety disorder: Secondary | ICD-10-CM | POA: Diagnosis not present

## 2020-09-23 ENCOUNTER — Telehealth: Payer: Self-pay | Admitting: Family Medicine

## 2020-09-23 NOTE — Telephone Encounter (Signed)
Left message for patient to call back and schedule Medicare Annual Wellness Visit (AWV) in office.   If not able to come in office, please offer to do virtually or by telephone.  Left office number and my jabber #[phone].  Due for AWVI  Please schedule at anytime with Nurse Health Advisor.   

## 2020-10-04 DIAGNOSIS — M25551 Pain in right hip: Secondary | ICD-10-CM | POA: Diagnosis not present

## 2020-11-18 ENCOUNTER — Other Ambulatory Visit: Payer: Self-pay | Admitting: Family Medicine

## 2020-11-18 DIAGNOSIS — Z1231 Encounter for screening mammogram for malignant neoplasm of breast: Secondary | ICD-10-CM

## 2020-12-27 ENCOUNTER — Other Ambulatory Visit: Payer: Self-pay

## 2020-12-27 ENCOUNTER — Ambulatory Visit
Admission: RE | Admit: 2020-12-27 | Discharge: 2020-12-27 | Disposition: A | Discharge status: Home / Self Care | Payer: Medicare PPO | Source: Ambulatory Visit | Attending: Family Medicine | Admitting: Family Medicine

## 2020-12-27 DIAGNOSIS — G894 Chronic pain syndrome: Secondary | ICD-10-CM | POA: Diagnosis not present

## 2020-12-27 DIAGNOSIS — M5136 Other intervertebral disc degeneration, lumbar region: Secondary | ICD-10-CM | POA: Diagnosis not present

## 2020-12-27 DIAGNOSIS — M542 Cervicalgia: Secondary | ICD-10-CM | POA: Diagnosis not present

## 2020-12-27 DIAGNOSIS — Z1231 Encounter for screening mammogram for malignant neoplasm of breast: Secondary | ICD-10-CM | POA: Diagnosis not present

## 2020-12-27 DIAGNOSIS — M5030 Other cervical disc degeneration, unspecified cervical region: Secondary | ICD-10-CM | POA: Diagnosis not present

## 2020-12-27 DIAGNOSIS — Z79891 Long term (current) use of opiate analgesic: Secondary | ICD-10-CM | POA: Diagnosis not present

## 2021-01-07 NOTE — Progress Notes (Signed)
Subjective:   Deeksha Cotrell is a 65 y.o. female who presents for an Initial Medicare Annual Wellness Visit.   I connected with Kiyanna today by telephone and verified that I am speaking with the correct person using two identifiers. Location patient: home Location provider: work Persons participating in the virtual visit: patient, Engineer, civil (consulting).    I discussed the limitations, risks, security and privacy concerns of performing an evaluation and management service by telephone and the availability of in person appointments. I also discussed with the patient that there may be a patient responsible charge related to this service. The patient expressed understanding and verbally consented to this telephonic visit.    Interactive audio and video telecommunications were attempted between this provider and patient, however failed, due to patient having technical difficulties OR patient did not have access to video capability.  We continued and completed visit with audio only.  Some vital signs may be absent or patient reported.   Time Spent with patient on telephone encounter: 25 minutes  Review of Systems     Cardiac Risk Factors include: advanced age (>28men, >46 women);hypertension;dyslipidemia;obesity (BMI >30kg/m2)     Objective:    Today's Vitals   01/10/21 0820  Weight: 207 lb (93.9 kg)  Height: 5\' 2"  (1.575 m)  PainSc: 2    Body mass index is 37.86 kg/m.  Advanced Directives 01/10/2021  Does Patient Have a Medical Advance Directive? Yes  Type of Advance Directive Living will    Current Medications (verified) Outpatient Encounter Medications as of 01/10/2021  Medication Sig   atorvastatin (LIPITOR) 40 MG tablet TAKE 1 TABLET(40 MG) BY MOUTH DAILY   cholecalciferol (VITAMIN D3) 25 MCG (1000 UNIT) tablet Take 1,000 Units by mouth daily.   DULoxetine (CYMBALTA) 30 MG capsule Take 90 mg by mouth daily.   HYDROcodone-acetaminophen (NORCO) 10-325 MG per tablet Take 1 tablet by mouth 2  (two) times daily.   metFORMIN (GLUCOPHAGE) 500 MG tablet TAKE 1 TABLET(500 MG) BY MOUTH DAILY   metoprolol succinate (TOPROL-XL) 100 MG 24 hr tablet Take 1 tablet (100 mg total) by mouth daily. Take with or immediately following a meal.   omeprazole (PRILOSEC) 20 MG capsule Take 20 mg by mouth daily.   No facility-administered encounter medications on file as of 01/10/2021.    Allergies (verified) Relafen [nabumetone]   History: Past Medical History:  Diagnosis Date   Arthritis    Depression    DJD (degenerative joint disease)    left knee   GERD (gastroesophageal reflux disease)    Hearing loss, sensorineural    HTN (hypertension)    Hyperlipidemia    Migraine    Osteopenia    Past Surgical History:  Procedure Laterality Date   BREAST BIOPSY Right 05/25/2016   JOINT REPLACEMENT  2008   left knee   Family History  Problem Relation Age of Onset   Dementia Mother    Diabetes Father    Diabetes Brother    Mental illness Daughter        anxiety, OCD   Diabetes Sister    Diabetes Brother    Heart attack Maternal Grandfather    Social History   Socioeconomic History   Marital status: Divorced    Spouse name: n/a   Number of children: 2   Years of education: Not on file   Highest education level: Not on file  Occupational History   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   Lives alone. Her children live in [place], [place] and [place], [place].   Social Determinants of Health   Financial Resource Strain: Low Risk    Difficulty of Paying Living Expenses: Not hard at all  Food Insecurity: No Food Insecurity   Worried About Programme researcher, broadcasting/film/video in the Last  Year: Never true   Ran Out of Food in the Last Year: Never true  Transportation Needs: No Transportation Needs   Lack of Transportation (Medical): No   Lack of Transportation (Non-Medical): No  Physical Activity: Inactive   Days of Exercise per Week: 0 days   Minutes of Exercise per Session: 0 min  Stress: No Stress Concern Present   Feeling of Stress : Not at all  Social Connections: Moderately Integrated   Frequency of Communication with Friends and Family: More than three times a week   Frequency of Social Gatherings with Friends and Family: More than three times a week   Attends Religious Services: More than 4 times per year   Active Member of [employer] or Organizations: Yes   Attends Engineer, structural: More than 4 times per year   Marital Status: Divorced    Tobacco Counseling Counseling given: Not Answered   Clinical Intake:  Pre-visit preparation completed: Yes  Pain : 0-10 Pain Score: 2  Pain Type: Chronic pain Pain Location: Hip Pain Orientation: Right Pain Onset: More than a month ago Pain Frequency: Constant     BMI - recorded: 37.86 Nutritional Status: BMI > 30  Obese Nutritional Risks: None Diabetes: No  How often do you need to have someone help you when you read instructions, pamphlets, or other written materials from your doctor or pharmacy?: 1 - Never  Diabetic?No  Interpreter Needed?: No  Information entered by :: Thomasenia Sales LPN   Activities of Daily Living In your present state of health, do you have any difficulty performing the following activities: 01/10/2021  Hearing? N  Vision? N  Difficulty concentrating or making decisions? N  Walking or climbing stairs? N  Dressing or bathing? N  Doing errands, shopping? N  Preparing Food and eating ? N  Using the Toilet? N  In the past six months, have you accidently leaked urine? N  Do you have problems with loss of bowel control? N  Managing your Medications? N  Managing your  Finances? N  Housekeeping or managing your Housekeeping? N  Some recent data might be hidden    Patient Care Team: Copland, Gwenlyn Found, MD as PCP - General (Family Medicine) Sheran Luz, MD as Consulting Physician (Physical Medicine and Rehabilitation)  Indicate any recent Medical Services you may have received from other than [facility] providers in the past year (date may be approximate).     Assessment:   This is a routine wellness examination for Makenzye.  Hearing/Vision screen Hearing Screening - Comments:: No issues Vision Screening - Comments:: Last eye exam-10/2019-[facility]  Dietary issues and exercise activities discussed: Current Exercise Habits: The patient does not participate in regular exercise at present, Exercise limited by: orthopedic condition(s) (hip pain)   Goals Addressed  This Visit's Progress    Patient Stated       Would like to lose some weight, drink more water & stay active       Depression Screen PHQ 2/9 Scores 01/10/2021 07/28/2020 10/12/2015 10/08/2015 12/22/2013  PHQ - 2 Score 0 0 0 0 0    Fall Risk Fall Risk  01/10/2021 07/28/2020 10/12/2015 10/08/2015 12/22/2013  Falls in the past year? 1 0 No No No  Number falls in past yr: 0 - - - -  Injury with Fall? 0 - - - -  Risk for fall due to : History of fall(s) - - - -  Follow up Falls prevention discussed - - - -    FALL RISK PREVENTION PERTAINING TO THE HOME:  Any stairs in or around the home? No  Home free of loose throw rugs in walkways, pet beds, electrical cords, etc? Yes  Adequate lighting in your home to reduce risk of falls? Yes   ASSISTIVE DEVICES UTILIZED TO PREVENT FALLS:  Life alert? No  Use of a cane, walker or w/c? No  Grab bars in the bathroom? Yes  Shower chair or bench in shower? No  Elevated toilet seat or a handicapped toilet? No   TIMED UP AND GO:  Was the test performed? No . Phone visit   Cognitive Function:Normal cognitive status assessed by this Nurse  Health Advisor. No abnormalities found.          Immunizations Immunization History  Administered Date(s) Administered   Influenza Split 12/20/2011, 11/13/2012, 11/06/2013   Influenza,inj,Quad PF,6+ Mos 10/30/2016, 10/17/2017   Influenza-Unspecified 10/01/2015, 11/02/2020   PFIZER(Purple Top)SARS-COV-2 Vaccination 04/05/2019, 04/26/2019   Pfizer Covid-19 Vaccine Bivalent Booster 64yrs & up 11/02/2020   Pneumococcal Conjugate-13 07/30/2019   Td 10/17/2017   Tdap 06/06/2007   Zoster, Live 12/27/2010    TDAP status: Up to date  Flu Vaccine status: Up to date  Pneumococcal vaccine status: Due, Education has been provided regarding the importance of this vaccine. Advised may receive this vaccine at local pharmacy or Health Dept. Aware to provide a copy of the vaccination record if obtained from local pharmacy or Health Dept. Verbalized acceptance and understanding.  Covid-19 vaccine status: Completed vaccines  Qualifies for Shingles Vaccine? Yes   Zostavax completed Yes   Shingrix Completed?: No.    Education has been provided regarding the importance of this vaccine. Patient has been advised to call insurance company to determine out of pocket expense if they have not yet received this vaccine. Advised may also receive vaccine at local pharmacy or Health Dept. Verbalized acceptance and understanding.  Screening Tests Health Maintenance  Topic Date Due   Zoster Vaccines- Shingrix (1 of 2) Never done   Pneumonia Vaccine 64+ Years old (2 - PPSV23 if available, else PCV20) 07/29/2020   COLONOSCOPY (Pts 45-64yrs Insurance coverage will need to be confirmed)  01/23/2021   MAMMOGRAM  12/28/2022   TETANUS/TDAP  10/18/2027   INFLUENZA VACCINE  Completed   DEXA SCAN  Completed   COVID-19 Vaccine  Completed   Hepatitis C Screening  Completed   HPV VACCINES  Aged Out    Health Maintenance  Health Maintenance Due  Topic Date Due   Zoster Vaccines- Shingrix (1 of 2) Never done    Pneumonia Vaccine 59+ Years old (2 - PPSV23 if available, else PCV20) 07/29/2020    Colorectal cancer screening: Type of screening: Colonoscopy. Completed 01/24/2016. Repeat every 5 years  Mammogram status: Completed bilateral 12/27/2020. Repeat every  year  Bone Density status: Completed 12/08/2019. Results reflect: Bone density results: OSTEOPENIA. Repeat every 2 years.  Lung Cancer Screening: (Low Dose CT Chest recommended if Age 43-80 years, 30 pack-year currently smoking OR have quit w/in 15years.) does not qualify.     Additional Screening:  Hepatitis C Screening: Completed 10/05/2014  Vision Screening: Recommended annual ophthalmology exams for early detection of glaucoma and other disorders of the eye. Is the patient up to date with their annual eye exam?  No  Who is the provider or what is the name of the office in which the patient attends annual eye exams? [facility] Patient plans to make an appt  Dental Screening: Recommended annual dental exams for proper oral hygiene  Community Resource Referral / Chronic Care Management: CRR required this visit?  No   CCM required this visit?  No      Plan:     I have personally reviewed and noted the following in the patient's chart:   Medical and social history Use of alcohol, tobacco or illicit drugs  Current medications and supplements including opioid prescriptions. Patient is currently taking opioid prescriptions. Information provided to patient regarding non-opioid alternatives. Patient advised to discuss non-opioid treatment plan with their provider. Functional ability and status Nutritional status Physical activity Advanced directives List of other physicians Hospitalizations, surgeries, and ER visits in previous 12 months Vitals Screenings to include cognitive, depression, and falls Referrals and appointments  In addition, I have reviewed and discussed with patient certain preventive protocols, quality metrics,  and best practice recommendations. A written personalized care plan for preventive services as well as general preventive health recommendations were provided to patient.   Due to this being a telephonic visit, the after visit summary with patients personalized plan was offered to patient via mail or my-chart. Patient would like to access on my-chart.   Roanna Raider, LPN   16/02/958  Nurse Health Advisor  Nurse Notes: None

## 2021-01-10 ENCOUNTER — Ambulatory Visit (INDEPENDENT_AMBULATORY_CARE_PROVIDER_SITE_OTHER): Payer: Medicare PPO

## 2021-01-10 VITALS — Ht 62.0 in | Wt 207.0 lb

## 2021-01-10 DIAGNOSIS — Z0000 Encounter for general adult medical examination without abnormal findings: Secondary | ICD-10-CM | POA: Diagnosis not present

## 2021-01-10 NOTE — Patient Instructions (Signed)
Kristen Scott , Thank you for taking time to complete your Medicare Wellness Visit. I appreciate your ongoing commitment to your health goals. Please review the following plan we discussed and let me know if I can assist you in the future.   Screening recommendations/referrals: Colonoscopy: Scheduled for 02/11/2021 Mammogram: Completed 12/27/2020-Due 12/27/2021 Bone Density: Completed 12/08/2019-Due 12/07/2021 Recommended yearly ophthalmology/optometry visit for glaucoma screening and checkup Recommended yearly dental visit for hygiene and checkup  Vaccinations: Influenza vaccine: Up to date Pneumococcal vaccine: Due for Pneumovax-23-May obtain vaccine at our office or your local pharmacy. Tdap vaccine: Up to date Shingles vaccine: Completed vaccines. Please bring vaccination dates to your next appt.   Covid-19:Up to date  Advanced directives: Please bring a copy of Living Will and/or Healthcare Power of Attorney for your chart.   Conditions/risks identified: See problem list  Next appointment: Follow up in one year for your annual wellness visit 01/12/2022 @ 8/20(phone visit)   Preventive Care 65 Years and Older, Female Preventive care refers to lifestyle choices and visits with your health care provider that can promote health and wellness. What does preventive care include? A yearly physical exam. This is also called an annual well check. Dental exams once or twice a year. Routine eye exams. Ask your health care provider how often you should have your eyes checked. Personal lifestyle choices, including: Daily care of your teeth and gums. Regular physical activity. Eating a healthy diet. Avoiding tobacco and drug use. Limiting alcohol use. Practicing safe sex. Taking low-dose aspirin every day. Taking vitamin and mineral supplements as recommended by your health care provider. What happens during an annual well check? The services and screenings done by your health care provider  during your annual well check will depend on your age, overall health, lifestyle risk factors, and family history of disease. Counseling  Your health care provider may ask you questions about your: Alcohol use. Tobacco use. Drug use. Emotional well-being. Home and relationship well-being. Sexual activity. Eating habits. History of falls. Memory and ability to understand (cognition). Work and work Astronomer. Reproductive health. Screening  You may have the following tests or measurements: Height, weight, and BMI. Blood pressure. Lipid and cholesterol levels. These may be checked every 5 years, or more frequently if you are over 15 years old. Skin check. Lung cancer screening. You may have this screening every year starting at age 36 if you have a 30-pack-year history of smoking and currently smoke or have quit within the past 15 years. Fecal occult blood test (FOBT) of the stool. You may have this test every year starting at age 69. Flexible sigmoidoscopy or colonoscopy. You may have a sigmoidoscopy every 5 years or a colonoscopy every 10 years starting at age 74. Hepatitis C blood test. Hepatitis B blood test. Sexually transmitted disease (STD) testing. Diabetes screening. This is done by checking your blood sugar (glucose) after you have not eaten for a while (fasting). You may have this done every 1-3 years. Bone density scan. This is done to screen for osteoporosis. You may have this done starting at age 53. Mammogram. This may be done every 1-2 years. Talk to your health care provider about how often you should have regular mammograms. Talk with your health care provider about your test results, treatment options, and if necessary, the need for more tests. Vaccines  Your health care provider may recommend certain vaccines, such as: Influenza vaccine. This is recommended every year. Tetanus, diphtheria, and acellular pertussis (Tdap, Td) vaccine. You may  need a Td booster every  10 years. Zoster vaccine. You may need this after age 84. Pneumococcal 13-valent conjugate (PCV13) vaccine. One dose is recommended after age 67. Pneumococcal polysaccharide (PPSV23) vaccine. One dose is recommended after age 51. Talk to your health care provider about which screenings and vaccines you need and how often you need them. This information is not intended to replace advice given to you by your health care provider. Make sure you discuss any questions you have with your health care provider. Document Released: 02/19/2015 Document Revised: 10/13/2015 Document Reviewed: 11/24/2014 Elsevier Interactive Patient Education  2017 [place] Prevention in the Home Falls can cause injuries. They can happen to people of all ages. There are many things you can do to make your home safe and to help prevent falls. What can I do on the outside of my home? Regularly fix the edges of walkways and driveways and fix any cracks. Remove anything that might make you trip as you walk through a door, such as a raised step or threshold. Trim any bushes or trees on the path to your home. Use bright outdoor lighting. Clear any walking paths of anything that might make someone trip, such as rocks or tools. Regularly check to see if handrails are loose or broken. Make sure that both sides of any steps have handrails. Any raised decks and porches should have guardrails on the edges. Have any leaves, snow, or ice cleared regularly. Use sand or salt on walking paths during winter. Clean up any spills in your garage right away. This includes oil or grease spills. What can I do in the bathroom? Use night lights. Install grab bars by the toilet and in the tub and shower. Do not use towel bars as grab bars. Use non-skid mats or decals in the tub or shower. If you need to sit down in the shower, use a plastic, non-slip stool. Keep the floor dry. Clean up any water that spills on the floor as soon as it  happens. Remove soap buildup in the tub or shower regularly. Attach bath mats securely with double-sided non-slip rug tape. Do not have throw rugs and other things on the floor that can make you trip. What can I do in the bedroom? Use night lights. Make sure that you have a light by your bed that is easy to reach. Do not use any sheets or blankets that are too big for your bed. They should not hang down onto the floor. Have a firm chair that has side arms. You can use this for support while you get dressed. Do not have throw rugs and other things on the floor that can make you trip. What can I do in the kitchen? Clean up any spills right away. Avoid walking on wet floors. Keep items that you use a lot in easy-to-reach places. If you need to reach something above you, use a strong step stool that has a grab bar. Keep electrical cords out of the way. Do not use floor polish or wax that makes floors slippery. If you must use wax, use non-skid floor wax. Do not have throw rugs and other things on the floor that can make you trip. What can I do with my stairs? Do not leave any items on the stairs. Make sure that there are handrails on both sides of the stairs and use them. Fix handrails that are broken or loose. Make sure that handrails are as long as the stairways.  Check any carpeting to make sure that it is firmly attached to the stairs. Fix any carpet that is loose or worn. Avoid having throw rugs at the top or bottom of the stairs. If you do have throw rugs, attach them to the floor with carpet tape. Make sure that you have a light switch at the top of the stairs and the bottom of the stairs. If you do not have them, ask someone to add them for you. What else can I do to help prevent falls? Wear shoes that: Do not have high heels. Have rubber bottoms. Are comfortable and fit you well. Are closed at the toe. Do not wear sandals. If you use a stepladder: Make sure that it is fully opened.  Do not climb a closed stepladder. Make sure that both sides of the stepladder are locked into place. Ask someone to hold it for you, if possible. Clearly mark and make sure that you can see: Any grab bars or handrails. First and last steps. Where the edge of each step is. Use tools that help you move around (mobility aids) if they are needed. These include: Canes. Walkers. Scooters. Crutches. Turn on the lights when you go into a dark area. Replace any light bulbs as soon as they burn out. Set up your furniture so you have a clear path. Avoid moving your furniture around. If any of your floors are uneven, fix them. If there are any pets around you, be aware of where they are. Review your medicines with your doctor. Some medicines can make you feel dizzy. This can increase your chance of falling. Ask your doctor what other things that you can do to help prevent falls. This information is not intended to replace advice given to you by your health care provider. Make sure you discuss any questions you have with your health care provider. Document Released: 11/19/2008 Document Revised: 07/01/2015 Document Reviewed: 02/27/2014 Elsevier Interactive Patient Education  2017 [employer].

## 2021-02-02 DIAGNOSIS — M25551 Pain in right hip: Secondary | ICD-10-CM | POA: Diagnosis not present

## 2021-02-11 DIAGNOSIS — Z1211 Encounter for screening for malignant neoplasm of colon: Secondary | ICD-10-CM | POA: Diagnosis not present

## 2021-02-11 DIAGNOSIS — D123 Benign neoplasm of transverse colon: Secondary | ICD-10-CM | POA: Diagnosis not present

## 2021-02-11 DIAGNOSIS — Z86010 Personal history of colonic polyps: Secondary | ICD-10-CM | POA: Diagnosis not present

## 2021-03-09 DIAGNOSIS — F411 Generalized anxiety disorder: Secondary | ICD-10-CM | POA: Diagnosis not present

## 2021-03-14 DIAGNOSIS — M5030 Other cervical disc degeneration, unspecified cervical region: Secondary | ICD-10-CM | POA: Diagnosis not present

## 2021-03-14 DIAGNOSIS — M5136 Other intervertebral disc degeneration, lumbar region: Secondary | ICD-10-CM | POA: Diagnosis not present

## 2021-03-14 DIAGNOSIS — G894 Chronic pain syndrome: Secondary | ICD-10-CM | POA: Diagnosis not present

## 2021-03-14 DIAGNOSIS — M542 Cervicalgia: Secondary | ICD-10-CM | POA: Diagnosis not present

## 2021-06-01 DIAGNOSIS — B359 Dermatophytosis, unspecified: Secondary | ICD-10-CM | POA: Diagnosis not present

## 2021-06-01 DIAGNOSIS — L299 Pruritus, unspecified: Secondary | ICD-10-CM | POA: Diagnosis not present

## 2021-06-06 DIAGNOSIS — G894 Chronic pain syndrome: Secondary | ICD-10-CM | POA: Diagnosis not present

## 2021-06-06 DIAGNOSIS — Z79899 Other long term (current) drug therapy: Secondary | ICD-10-CM | POA: Diagnosis not present

## 2021-06-06 DIAGNOSIS — M5136 Other intervertebral disc degeneration, lumbar region: Secondary | ICD-10-CM | POA: Diagnosis not present

## 2021-06-06 DIAGNOSIS — Z5181 Encounter for therapeutic drug level monitoring: Secondary | ICD-10-CM | POA: Diagnosis not present

## 2021-06-06 DIAGNOSIS — M542 Cervicalgia: Secondary | ICD-10-CM | POA: Diagnosis not present

## 2021-06-06 DIAGNOSIS — M5030 Other cervical disc degeneration, unspecified cervical region: Secondary | ICD-10-CM | POA: Diagnosis not present

## 2021-07-28 ENCOUNTER — Other Ambulatory Visit: Payer: Self-pay | Admitting: Family Medicine

## 2021-07-28 DIAGNOSIS — R7303 Prediabetes: Secondary | ICD-10-CM

## 2021-07-28 DIAGNOSIS — I10 Essential (primary) hypertension: Secondary | ICD-10-CM

## 2021-07-31 NOTE — Progress Notes (Signed)
[facility] [address], [address] [place], [place] [address] [phone]  Date:  08/03/2021   Name:  Kristen Scott   DOB:  05-29-1955   MRN:  [id number]  PCP:  Pearline Cables, MD    Chief Complaint: medication follow up (Concerns/ questions: right ear discomfort. )   History of Present Illness:  Kristen Scott is a 66 y.o. 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  It sounds as though she may have mistakenly had a third dose of Shingrix, advised her this will not be harmful Colon cancer screening- scope 1/23- Dr Jason Fila with digestive health, given 5 years follow-up  Labs done 1 year ago, update today- she is not quite fasting today Bone density 11/21, mammogram 11/22 Pap done last summer, normal  She is on cymbalta but rx by other provider   She started hearing aids 3 years ago- she notes some pain in her right ear and her hearing seems a bit worse than normal.  She wonders if she might have earwax  Atorvastatin OTC vitamin D Cymbalta Hydrocodone Metformin Toprol XL 100 Omeprazole  Lab Results  Component Value Date   HGBA1C 6.3 07/28/2020    Patient Active Problem List   Diagnosis Date Noted   Osteopenia 12/08/2019    Carpal tunnel syndrome 09/30/2015   Pre-diabetes 10/05/2014   GERD (gastroesophageal reflux disease) 03/05/2014   Vitamin D deficiency 08/20/2011   Hyperlipidemia 08/20/2011   HTN (hypertension) 08/20/2011   Obesity, Class II, BMI 35-39.9 08/20/2011   Chronic pain disorder 08/20/2011   Depression 08/20/2011   DJD (degenerative joint disease) of knee 08/20/2011   Arthritis, low back 03/30/2011   Arthritis of neck (HCC) 03/30/2011    Past Medical History:  Diagnosis Date   Arthritis    Depression    DJD (degenerative joint disease)    left knee   GERD (gastroesophageal reflux disease)    Hearing loss, sensorineural    HTN (hypertension)    Hyperlipidemia    Migraine    Osteopenia     Past Surgical History:  Procedure Laterality Date   BREAST BIOPSY Right 05/25/2016   JOINT REPLACEMENT  2008   left knee    Social History   Tobacco Use   Smoking status: Never   Smokeless tobacco: Never  Substance Use Topics   Alcohol use: No   Drug use: No    Family History  Problem Relation Age of Onset   Dementia Mother    Diabetes Father    Diabetes Brother    Mental illness Daughter        anxiety, OCD   Diabetes Sister    Diabetes Brother    Heart attack Maternal Grandfather     Allergies  Allergen Reactions  Relafen [Nabumetone] Rash    Medication list has been reviewed and updated.  Current Outpatient Medications on File Prior to Visit  Medication Sig Dispense Refill   cholecalciferol (VITAMIN D3) 25 MCG (1000 UNIT) tablet Take 1,000 Units by mouth daily.     DULoxetine (CYMBALTA) 30 MG capsule Take 90 mg by mouth daily.     HYDROcodone-acetaminophen (NORCO) 10-325 MG per tablet Take 1 tablet by mouth 2 (two) times daily.     metFORMIN (GLUCOPHAGE) 500 MG tablet TAKE 1 TABLET(500 MG) BY MOUTH DAILY 90 tablet 3   metoprolol succinate (TOPROL-XL) 100 MG 24 hr tablet TAKE 1 TABLET(100 MG) BY MOUTH DAILY WITH OR IMMEDIATELY FOLLOWING A MEAL 90 tablet 3    omeprazole (PRILOSEC) 20 MG capsule Take 20 mg by mouth daily.     No current facility-administered medications on file prior to visit.    Review of Systems:  As per HPI- otherwise negative.  Wt Readings from Last 3 Encounters:  08/03/21 192 lb 3.2 oz (87.2 kg)  01/10/21 207 lb (93.9 kg)  07/28/20 207 lb (93.9 kg)      Physical Examination: Vitals:   08/03/21 1507  BP: 120/80  Pulse: 73  Resp: 18  Temp: 97.6 F (36.4 C)  SpO2: 97%   Vitals:   08/03/21 1507  Weight: 192 lb 3.2 oz (87.2 kg)  Height: 5\' 2"  (1.575 m)   Body mass index is 35.15 kg/m. Ideal Body Weight: Weight in (lb) to have BMI = 25: 136.4  GEN: no acute distress.  Moderately obese, looks well HEENT: Atraumatic, Normocephalic.  Patient has significant wax and dead skin buildup in both ears. Oropharynx normal.  PEERL,EOMI.   Ears and Nose: No external deformity. CV: RRR, No M/G/R. No JVD. No thrill. No extra heart sounds. PULM: CTA B, no wheezes, crackles, rhonchi. No retractions. No resp. distress. No accessory muscle use. ABD: S, NT, ND. No rebound. No HSM. EXTR: No c/c/e PSYCH: Normally interactive. Conversant.   Verbal consent obtained.  Both ears are irrigated with copious amounts of warm water, earwax and dead skin removed successfully.  TM and ear canal are then observed to be normal bilaterally.  Patient tolerated well with no complications, hearing improved Assessment and Plan: Physical exam - Plan: CBC  Hyperlipidemia, unspecified hyperlipidemia type - Plan: Lipid panel, atorvastatin (LIPITOR) 40 MG tablet  Pre-diabetes - Plan: Comprehensive metabolic panel, Hemoglobin A1c  Elevated BUN - Plan: Comprehensive metabolic panel  Essential hypertension - Plan: CBC, Comprehensive metabolic panel  Screening for thyroid disorder - Plan: TSH  Fatigue, unspecified type - Plan: TSH  Encounter for screening mammogram for malignant neoplasm of breast  Bilateral impacted cerumen  Physical exam  today.  Encouraged healthy diet and exercise routine Will plan further follow- up pending labs. Cerumen impaction resolved as above  Signed , MD  Received patient labs 6/29, sent message A1c is stable Taking Lipitor Results for orders placed or performed in visit on 08/03/21  CBC  Result Value Ref Range   WBC 6.6 4.0 - 10.5 K/uL   RBC 4.43 3.87 - 5.11 Mil/uL   Platelets 245.0 150.0 - 400.0 K/uL   Hemoglobin 12.3 12.0 - 15.0 g/dL   HCT 08/05/21 93.2 - 67.1 %   MCV 83.6 78.0 - 100.0 fl   MCHC 33.1 30.0 - 36.0 g/dL   RDW 24.5 80.9 - 98.3 %  Comprehensive metabolic panel  Result Value Ref Range   Sodium 137 135 - 145 mEq/L  Potassium 3.9 3.5 - 5.1 mEq/L   Chloride 101 96 - 112 mEq/L   CO2 27 19 - 32 mEq/L   Glucose, Bld 95 70 - 99 mg/dL   BUN 22 6 - 23 mg/dL   Creatinine, Ser 1.61 0.40 - 1.20 mg/dL   Total Bilirubin 0.4 0.2 - 1.2 mg/dL   Alkaline Phosphatase 73 39 - 117 U/L   AST 16 0 - 37 U/L   ALT 11 0 - 35 U/L   Total Protein 7.8 6.0 - 8.3 g/dL   Albumin 4.4 3.5 - 5.2 g/dL   GFR 09.60 (L) >45.40 mL/min   Calcium 9.9 8.4 - 10.5 mg/dL  Hemoglobin J8J  Result Value Ref Range   Hgb A1c MFr Bld 6.1 4.6 - 6.5 %  Lipid panel  Result Value Ref Range   Cholesterol 210 (H) 0 - 200 mg/dL   Triglycerides 191.4 (H) 0.0 - 149.0 mg/dL   HDL 78.29 >56.21 mg/dL   VLDL 30.8 0.0 - 65.7 mg/dL   LDL Cholesterol 846 (H) 0 - 99 mg/dL   Total CHOL/HDL Ratio 4    NonHDL 152.93   TSH  Result Value Ref Range   TSH 0.54 0.35 - 5.50 uIU/mL

## 2021-07-31 NOTE — Patient Instructions (Addendum)
It was good to see you today, I will be in touch with your labs asap!  Let me know if any problems with your ears Great job with weight loss so far!!   We can do your pneumonia vaccine the next time we see you

## 2021-08-03 ENCOUNTER — Ambulatory Visit: Payer: Medicare PPO | Admitting: Family Medicine

## 2021-08-03 VITALS — BP 120/80 | HR 73 | Temp 97.6°F | Resp 18 | Ht 62.0 in | Wt 192.2 lb

## 2021-08-03 DIAGNOSIS — R799 Abnormal finding of blood chemistry, unspecified: Secondary | ICD-10-CM | POA: Diagnosis not present

## 2021-08-03 DIAGNOSIS — I10 Essential (primary) hypertension: Secondary | ICD-10-CM

## 2021-08-03 DIAGNOSIS — R5383 Other fatigue: Secondary | ICD-10-CM | POA: Diagnosis not present

## 2021-08-03 DIAGNOSIS — Z1329 Encounter for screening for other suspected endocrine disorder: Secondary | ICD-10-CM | POA: Diagnosis not present

## 2021-08-03 DIAGNOSIS — E785 Hyperlipidemia, unspecified: Secondary | ICD-10-CM

## 2021-08-03 DIAGNOSIS — R7303 Prediabetes: Secondary | ICD-10-CM | POA: Diagnosis not present

## 2021-08-03 DIAGNOSIS — H6123 Impacted cerumen, bilateral: Secondary | ICD-10-CM | POA: Diagnosis not present

## 2021-08-03 DIAGNOSIS — Z0000 Encounter for general adult medical examination without abnormal findings: Secondary | ICD-10-CM | POA: Diagnosis not present

## 2021-08-03 DIAGNOSIS — Z1231 Encounter for screening mammogram for malignant neoplasm of breast: Secondary | ICD-10-CM | POA: Diagnosis not present

## 2021-08-03 MED ORDER — ATORVASTATIN CALCIUM 40 MG PO TABS: ORAL_TABLET | ORAL | 3 refills | Status: DC

## 2021-08-04 ENCOUNTER — Encounter: Payer: Self-pay | Admitting: Family Medicine

## 2021-08-04 LAB — COMPREHENSIVE METABOLIC PANEL
ALT: 11 U/L (ref 0–35)
AST: 16 U/L (ref 0–37)
Albumin: 4.4 g/dL (ref 3.5–5.2)
Alkaline Phosphatase: 73 U/L (ref 39–117)
BUN: 22 mg/dL (ref 6–23)
CO2: 27 mEq/L (ref 19–32)
Calcium: 9.9 mg/dL (ref 8.4–10.5)
Chloride: 101 mEq/L (ref 96–112)
Creatinine, Ser: 0.98 mg/dL (ref 0.40–1.20)
GFR: 59.54 mL/min — ABNORMAL LOW (ref >60.00)
Glucose, Bld: 95 mg/dL (ref 70–99)
Potassium: 3.9 mEq/L (ref 3.5–5.1)
Sodium: 137 mEq/L (ref 135–145)
Total Bilirubin: 0.4 mg/dL (ref 0.2–1.2)
Total Protein: 7.8 g/dL (ref 6.0–8.3)

## 2021-08-04 LAB — LIPID PANEL
Cholesterol: 210 mg/dL — ABNORMAL HIGH (ref 0–200)
HDL: 57 mg/dL (ref >39.00)
LDL Cholesterol: 119 mg/dL — ABNORMAL HIGH (ref 0–99)
NonHDL: 152.93
Total CHOL/HDL Ratio: 4
Triglycerides: 170 mg/dL — ABNORMAL HIGH (ref 0.0–149.0)
VLDL: 34 mg/dL (ref 0.0–40.0)

## 2021-08-04 LAB — CBC
HCT: 37 % (ref 36.0–46.0)
Hemoglobin: 12.3 g/dL (ref 12.0–15.0)
MCHC: 33.1 g/dL (ref 30.0–36.0)
MCV: 83.6 fl (ref 78.0–100.0)
Platelets: 245 10*3/uL (ref 150.0–400.0)
RBC: 4.43 Mil/uL (ref 3.87–5.11)
RDW: 13.2 % (ref 11.5–15.5)
WBC: 6.6 10*3/uL (ref 4.0–10.5)

## 2021-08-04 LAB — HEMOGLOBIN A1C: Hgb A1c MFr Bld: 6.1 % (ref 4.6–6.5)

## 2021-08-04 LAB — TSH: TSH: 0.54 u[IU]/mL (ref 0.35–5.50)

## 2021-08-04 NOTE — Addendum Note (Signed)
Addended by: Abbe Amsterdam C on: 08/04/2021 12:38 PM   Modules accepted: Orders

## 2021-08-23 DIAGNOSIS — Z86010 Personal history of colonic polyps: Secondary | ICD-10-CM | POA: Diagnosis not present

## 2021-08-23 DIAGNOSIS — K219 Gastro-esophageal reflux disease without esophagitis: Secondary | ICD-10-CM | POA: Diagnosis not present

## 2021-08-31 DIAGNOSIS — F411 Generalized anxiety disorder: Secondary | ICD-10-CM | POA: Diagnosis not present

## 2021-10-17 DIAGNOSIS — G894 Chronic pain syndrome: Secondary | ICD-10-CM | POA: Diagnosis not present

## 2021-10-17 DIAGNOSIS — M5136 Other intervertebral disc degeneration, lumbar region: Secondary | ICD-10-CM | POA: Diagnosis not present

## 2021-10-17 DIAGNOSIS — M5030 Other cervical disc degeneration, unspecified cervical region: Secondary | ICD-10-CM | POA: Diagnosis not present

## 2021-10-26 ENCOUNTER — Other Ambulatory Visit: Payer: Self-pay | Admitting: Family Medicine

## 2021-10-26 DIAGNOSIS — R7303 Prediabetes: Secondary | ICD-10-CM

## 2021-11-04 NOTE — Progress Notes (Unsigned)
[place] [facility] [address] [place], [place] [address] [phone]  Date:  11/09/2021   Name:  Kristen Scott   DOB:  05/31/1955   MRN:  [id number]  PCP:  Darreld Mclean, MD    Chief Complaint: No chief complaint on file.   History of Present Illness:  Kristen Scott is a 66 y.o. very pleasant female patient who presents with the following:  Patient seen today for follow-up, medication review Most recent visit with myself was in June of this year for physical- history of hypertension, prediabetes, hyperlipidemia, chronic arthritis pain, obesity  She has 2 adult children, her daughter lives locally and her son is in [place].  2 young grandchildren  Her chronic back pain is managed by Dr. Nelva Bush Colon cancer screening up-to-date Bone density Mammogram Can give update pneumonia vaccine COVID booster recommended Flu shot Labs done in June including CMP, lipid, CBC, A1c at 6.1, TSH  Atorvastatin 40 Cymbalta Metformin 500 daily Toprol XL 100 Prilosec  Patient Active Problem List   Diagnosis Date Noted   Osteopenia 12/08/2019   Carpal tunnel syndrome 09/30/2015   Pre-diabetes 10/05/2014   GERD (gastroesophageal reflux disease) 03/05/2014   Vitamin D deficiency 08/20/2011   Hyperlipidemia 08/20/2011   HTN (hypertension) 08/20/2011   Obesity, Class II, BMI 35-39.9 08/20/2011   Chronic pain disorder 08/20/2011   Depression 08/20/2011   DJD (degenerative joint disease) of knee 08/20/2011   Arthritis, low back 03/30/2011   Arthritis of neck ([place]) 03/30/2011    Past Medical History:  Diagnosis Date   Arthritis    Depression    DJD (degenerative joint disease)    left knee   GERD (gastroesophageal reflux disease)    Hearing loss, sensorineural    HTN (hypertension)    Hyperlipidemia    Migraine    Osteopenia     Past Surgical History:  Procedure Laterality Date   BREAST BIOPSY Right 05/25/2016    JOINT REPLACEMENT  2008   left knee    Social History   Tobacco Use   Smoking status: Never   Smokeless tobacco: Never  Substance Use Topics   Alcohol use: No   Drug use: No    Family History  Problem Relation Age of Onset   Dementia Mother    Diabetes Father    Diabetes Brother    Mental illness Daughter        anxiety, OCD   Diabetes Sister    Diabetes Brother    Heart attack Maternal Grandfather     Allergies  Allergen Reactions   Relafen [Nabumetone] Rash    Medication list has been reviewed and updated.  Current Outpatient Medications on File Prior to Visit  Medication Sig Dispense Refill   atorvastatin (LIPITOR) 40 MG tablet TAKE 1 TABLET(40 MG) BY MOUTH DAILY 90 tablet 3   cholecalciferol (VITAMIN D3) 25 MCG (1000 UNIT) tablet Take 1,000 Units by mouth daily.     DULoxetine (CYMBALTA) 30 MG capsule Take 90 mg by mouth daily.     HYDROcodone-acetaminophen (NORCO) 10-325 MG per tablet Take 1 tablet by mouth 2 (two) times daily.     metFORMIN (GLUCOPHAGE) 500 MG tablet Take 1 tablet (500 mg total) by mouth daily with breakfast. 90 tablet 1   metoprolol succinate (TOPROL-XL) 100 MG 24 hr tablet TAKE 1 TABLET(100 MG) BY MOUTH DAILY WITH OR IMMEDIATELY FOLLOWING A MEAL 90 tablet 3   omeprazole (PRILOSEC) 20 MG  capsule Take 20 mg by mouth daily.     No current facility-administered medications on file prior to visit.    Review of Systems:  As per HPI- otherwise negative.   Physical Examination: There were no vitals filed for this visit. There were no vitals filed for this visit. There is no height or weight on file to calculate BMI. Ideal Body Weight:    GEN: no acute distress. HEENT: Atraumatic, Normocephalic.  Ears and Nose: No external deformity. CV: RRR, No M/G/R. No JVD. No thrill. No extra heart sounds. PULM: CTA B, no wheezes, crackles, rhonchi. No retractions. No resp. distress. No accessory muscle use. ABD: S, NT, ND, +BS. No rebound. No  HSM. EXTR: No c/c/e PSYCH: Normally interactive. Conversant.    Assessment and Plan: ***  Signed Abbe Amsterdam, MD

## 2021-11-04 NOTE — Patient Instructions (Incomplete)
It was great to see you again today I would suggest getting the newest COVID-vaccine and a dose of RSV this fall Assuming all is well, please see me in about 6 months

## 2021-11-09 ENCOUNTER — Ambulatory Visit: Payer: Medicare PPO | Admitting: Family Medicine

## 2021-11-09 VITALS — BP 134/88 | HR 124 | Temp 97.9°F | Resp 18 | Ht 62.0 in | Wt 195.6 lb

## 2021-11-09 DIAGNOSIS — R7303 Prediabetes: Secondary | ICD-10-CM

## 2021-11-09 DIAGNOSIS — E2839 Other primary ovarian failure: Secondary | ICD-10-CM | POA: Diagnosis not present

## 2021-11-09 DIAGNOSIS — Z1231 Encounter for screening mammogram for malignant neoplasm of breast: Secondary | ICD-10-CM

## 2021-11-09 DIAGNOSIS — E669 Obesity, unspecified: Secondary | ICD-10-CM | POA: Diagnosis not present

## 2021-11-09 DIAGNOSIS — E785 Hyperlipidemia, unspecified: Secondary | ICD-10-CM

## 2021-11-09 DIAGNOSIS — I10 Essential (primary) hypertension: Secondary | ICD-10-CM | POA: Diagnosis not present

## 2021-11-09 DIAGNOSIS — Z23 Encounter for immunization: Secondary | ICD-10-CM

## 2021-11-09 MED ORDER — SEMAGLUTIDE-WEIGHT MANAGEMENT 0.25 MG/0.5ML ~~LOC~~ SOAJ: 0.2500 mg | SUBCUTANEOUS | 2 refills | Status: DC

## 2021-11-09 MED ORDER — OZEMPIC (0.25 OR 0.5 MG/DOSE) 2 MG/3ML ~~LOC~~ SOPN: PEN_INJECTOR | SUBCUTANEOUS | 0 refills | Status: DC

## 2021-12-05 DIAGNOSIS — L281 Prurigo nodularis: Secondary | ICD-10-CM | POA: Diagnosis not present

## 2021-12-05 DIAGNOSIS — L820 Inflamed seborrheic keratosis: Secondary | ICD-10-CM | POA: Diagnosis not present

## 2021-12-26 ENCOUNTER — Encounter: Payer: Self-pay | Admitting: Family Medicine

## 2021-12-27 ENCOUNTER — Ambulatory Visit
Admission: RE | Admit: 2021-12-27 | Discharge: 2021-12-27 | Disposition: A | Discharge status: Home / Self Care | Payer: Medicare PPO | Source: Ambulatory Visit | Attending: Family Medicine | Admitting: Family Medicine

## 2021-12-27 DIAGNOSIS — E2839 Other primary ovarian failure: Secondary | ICD-10-CM

## 2021-12-27 DIAGNOSIS — Z780 Asymptomatic menopausal state: Secondary | ICD-10-CM | POA: Diagnosis not present

## 2021-12-27 DIAGNOSIS — M8589 Other specified disorders of bone density and structure, multiple sites: Secondary | ICD-10-CM | POA: Diagnosis not present

## 2021-12-28 ENCOUNTER — Encounter: Payer: Self-pay | Admitting: Family Medicine

## 2022-01-12 ENCOUNTER — Ambulatory Visit (INDEPENDENT_AMBULATORY_CARE_PROVIDER_SITE_OTHER): Payer: Medicare PPO | Admitting: *Deleted

## 2022-01-12 VITALS — BP 120/71 | HR 86 | Ht 62.0 in | Wt 193.4 lb

## 2022-01-12 DIAGNOSIS — Z0000 Encounter for general adult medical examination without abnormal findings: Secondary | ICD-10-CM

## 2022-01-12 NOTE — Progress Notes (Signed)
Subjective:   Kristen Scott is a 66 y.o. female who presents for Medicare Annual (Subsequent) preventive examination.  Review of Systems    Defer to PCP Cardiac Risk Factors include: advanced age (>46men, >23 women);hypertension;dyslipidemia;obesity (BMI >30kg/m2)     Objective:    Today's Vitals   01/12/22 0830  BP: 120/71  Pulse: 86  Weight: 193 lb 6.4 oz (87.7 kg)  Height: 5\' 2"  (1.575 m)   Body mass index is 35.37 kg/m.     01/12/2022    8:31 AM 01/10/2021    8:24 AM  Advanced Directives  Does Patient Have a Medical Advance Directive? Yes Yes  Type of 14/06/2020 of [place];Living will Living will  Copy of Healthcare Power of Attorney in Chart? No - copy requested     Current Medications (verified) Outpatient Encounter Medications as of 01/12/2022  Medication Sig   atorvastatin (LIPITOR) 40 MG tablet TAKE 1 TABLET(40 MG) BY MOUTH DAILY   cholecalciferol (VITAMIN D3) 25 MCG (1000 UNIT) tablet Take 1,000 Units by mouth daily.   DULoxetine (CYMBALTA) 30 MG capsule Take 90 mg by mouth daily.   HYDROcodone-acetaminophen (NORCO) 10-325 MG per tablet Take 1 tablet by mouth 2 (two) times daily.   metFORMIN (GLUCOPHAGE) 500 MG tablet Take 1 tablet (500 mg total) by mouth daily with breakfast.   metoprolol succinate (TOPROL-XL) 100 MG 24 hr tablet TAKE 1 TABLET(100 MG) BY MOUTH DAILY WITH OR IMMEDIATELY FOLLOWING A MEAL   omeprazole (PRILOSEC) 20 MG capsule Take 20 mg by mouth daily.   Semaglutide,0.25 or 0.5MG /DOS, (OZEMPIC, 0.25 OR 0.5 MG/DOSE,) 2 MG/3ML SOPN Inject 0.25 mg weekly for 4 weeks, then increase to 0.5 mg weekly   Semaglutide-Weight Management 0.25 MG/0.5ML SOAJ Inject 0.25 mg into the skin once a week.   No facility-administered encounter medications on file as of 01/12/2022.    Allergies (verified) Relafen [nabumetone]   History: Past Medical History:  Diagnosis Date   Arthritis    Depression    DJD (degenerative joint disease)     left knee   GERD (gastroesophageal reflux disease)    Hearing loss, sensorineural    HTN (hypertension)    Hyperlipidemia    Migraine    Osteopenia    Past Surgical History:  Procedure Laterality Date   BREAST BIOPSY Right 05/25/2016   JOINT REPLACEMENT  2008   left knee   Family History  Problem Relation Age of Onset   Dementia Mother    Diabetes Father    Diabetes Brother    Mental illness Daughter        anxiety, OCD   Diabetes Sister    Diabetes Brother    Heart attack Maternal Grandfather    Social History   Socioeconomic History   Marital status: Divorced    Spouse name: n/a   Number of children: 2   Years of education: Not on file   Highest education level: Not on file  Occupational History   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   Lives alone. Her children live in [place], [place] and [place], [place].   Social Determinants of  Health   Financial Resource Strain: Low Risk  (01/10/2021)   Overall Financial Resource Strain (CARDIA)    Difficulty of Paying Living Expenses: Not hard at all  Food Insecurity: No Food Insecurity (01/12/2022)   Hunger Vital Sign    Worried About Running Out of Food in the Last Year: Never true    Ran Out of Food in the Last Year: Never true  Transportation Needs: No Transportation Needs (01/12/2022)   PRAPARE - Administrator, Civil Service (Medical): No    Lack of Transportation (Non-Medical): No  Physical Activity: Inactive (01/10/2021)   Exercise Vital Sign    Days of Exercise per Week: 0 days    Minutes of Exercise per Session: 0 min  Stress: No Stress Concern Present (01/10/2021)   [employer] of  Occupational Health - Occupational Stress Questionnaire    Feeling of Stress : Not at all  Social Connections: Moderately Isolated (01/12/2022)   Social Connection and Isolation Panel [NHANES]    Frequency of Communication with Friends and Family: More than three times a week    Frequency of Social Gatherings with Friends and Family: More than three times a week    Attends Religious Services: More than 4 times per year    Active Member of [employer] or Organizations: No    Attends Engineer, structural: Never    Marital Status: Divorced    Tobacco Counseling Counseling given: Not Answered   Clinical Intake:  Pre-visit preparation completed: Yes  Pain : No/denies pain  Diabetes: No  How often do you need to have someone help you when you read instructions, pamphlets, or other written materials from your doctor or pharmacy?: 1 - Never   Activities of Daily Living    01/12/2022    8:35 AM  In your present state of health, do you have any difficulty performing the following activities:  Hearing? 1  Comment wears hearing aids  Vision? 0  Difficulty concentrating or making decisions? 1  Walking or climbing stairs? 0  Dressing or bathing? 0  Doing errands, shopping? 0  Preparing Food and eating ? N  Using the Toilet? N  In the past six months, have you accidently leaked urine? N  Do you have problems with loss of bowel control? N  Managing your Medications? N  Managing your Finances? N  Housekeeping or managing your Housekeeping? N    Patient Care Team: Copland, Gwenlyn Found, MD as PCP - General (Family Medicine) Sheran Luz, MD as Consulting Physician (Physical Medicine and Rehabilitation)  Indicate any recent Medical Services you may have received from other than [facility] providers in the past year (date may be approximate).     Assessment:   This is a routine wellness examination for Kristen Scott.  Hearing/Vision screen No results found.  Dietary issues and exercise  activities discussed: Current Exercise Habits: The patient does not participate in regular exercise at present, Exercise limited by: None identified   Goals Addressed   None    Depression Screen    01/12/2022    8:35 AM 01/10/2021    8:27 AM 07/28/2020    9:15 AM 10/12/2015   10:04 AM 10/08/2015   10:01 AM 12/22/2013    9:24 AM  PHQ 2/9 Scores  PHQ - 2 Score 0 0 0 0 0 0    Fall Risk    01/12/2022    8:32 AM 01/10/2021    8:25 AM 07/28/2020    9:13 AM 10/12/2015  10:04 AM 10/08/2015   10:01 AM  Fall Risk   Falls in the past year? 0 1 0 No No  Number falls in past yr: 0 0     Injury with Fall? 0 0     Risk for fall due to : No Fall Risks History of fall(s)     Follow up Falls evaluation completed Falls prevention discussed       FALL RISK PREVENTION PERTAINING TO THE HOME:  Any stairs in or around the home? No  If so, are there any without handrails? No  Home free of loose throw rugs in walkways, pet beds, electrical cords, etc? Yes  Adequate lighting in your home to reduce risk of falls? Yes   ASSISTIVE DEVICES UTILIZED TO PREVENT FALLS:  Life alert? No  Use of a cane, walker or w/c? No  Grab bars in the bathroom? Yes  Shower chair or bench in shower? No  Elevated toilet seat or a handicapped toilet? No   TIMED UP AND GO:  Was the test performed? Yes .  Length of time to ambulate 10 feet: 5 sec.   Gait steady and fast without use of assistive device  Cognitive Function:        01/12/2022    8:39 AM  6CIT Screen  What Year? 0 points  What month? 0 points  What time? 0 points  Count back from 20 0 points  Months in reverse 0 points  Repeat phrase 4 points  Total Score 4 points    Immunizations Immunization History  Administered Date(s) Administered   Influenza Split 12/20/2011, 11/13/2012, 11/06/2013   Influenza,inj,Quad PF,6+ Mos 10/30/2016, 10/17/2017   Influenza-Unspecified 10/01/2015, 11/02/2020   PFIZER(Purple Top)SARS-COV-2 Vaccination 04/05/2019,  04/26/2019   Pfizer Covid-19 Vaccine Bivalent Booster 61yrs & up 11/02/2020   Pneumococcal Conjugate-13 07/30/2019   Pneumococcal Polysaccharide-23 11/09/2021   Td 10/17/2017   Tdap 06/06/2007   Zoster Recombinat (Shingrix) 10/16/2018, 12/21/2018   Zoster, Live 12/27/2010    TDAP status: Up to date  Flu Vaccine status: Up to date  Pneumococcal vaccine status: Up to date  Covid-19 vaccine status: Information provided on how to obtain vaccines.   Qualifies for Shingles Vaccine? Yes   Zostavax completed Yes   Shingrix Completed?: Yes  Screening Tests Health Maintenance  Topic Date Due   COVID-19 Vaccine (4 - 2023-24 season) 10/07/2021   Medicare Annual Wellness (AWV)  01/10/2022   MAMMOGRAM  12/28/2022   COLONOSCOPY (Pts 45-39yrs Insurance coverage will need to be confirmed)  02/06/2026   DTaP/Tdap/Td (3 - Td or Tdap) 10/18/2027   Pneumonia Vaccine 77+ Years old  Completed   INFLUENZA VACCINE  Completed   DEXA SCAN  Completed   Hepatitis C Screening  Completed   Zoster Vaccines- Shingrix  Completed   HPV VACCINES  Aged Out    Health Maintenance  Health Maintenance Due  Topic Date Due   COVID-19 Vaccine (4 - 2023-24 season) 10/07/2021   Medicare Annual Wellness (AWV)  01/10/2022    Colorectal cancer screening: Type of screening: Colonoscopy. Completed 02/06/21. Repeat every 5 years  Mammogram status: Completed 12/27/20. Repeat every year. Scheduled for 02/17/22  Bone Density status: Completed 12/27/21. Results reflect: Bone density results: OSTEOPENIA. Repeat every 2 years.  Lung Cancer Screening: (Low Dose CT Chest recommended if Age 22-80 years, 30 pack-year currently smoking OR have quit w/in 15years.) does not qualify.   Additional Screening:  Hepatitis C Screening: does qualify; Completed 10/05/14  Vision Screening: Recommended annual  ophthalmology exams for early detection of glaucoma and other disorders of the eye. Is the patient up to date with their annual  eye exam?  Yes  Who is the provider or what is the name of the office in which the patient attends annual eye exams? [facility] If pt is not established with a provider, would they like to be referred to a provider to establish care? No .   Dental Screening: Recommended annual dental exams for proper oral hygiene  Community Resource Referral / Chronic Care Management: CRR required this visit?  No   CCM required this visit?  No      Plan:     I have personally reviewed and noted the following in the patient's chart:   Medical and social history Use of alcohol, tobacco or illicit drugs  Current medications and supplements including opioid prescriptions. Patient is currently taking opioid prescriptions. Information provided to patient regarding non-opioid alternatives. Patient advised to discuss non-opioid treatment plan with their provider. Functional ability and status Nutritional status Physical activity Advanced directives List of other physicians Hospitalizations, surgeries, and ER visits in previous 12 months Vitals Screenings to include cognitive, depression, and falls Referrals and appointments  In addition, I have reviewed and discussed with patient certain preventive protocols, quality metrics, and best practice recommendations. A written personalized care plan for preventive services as well as general preventive health recommendations were provided to patient.     Beatris Ship, [place]   01/12/2022   Nurse Notes: None

## 2022-01-12 NOTE — Patient Instructions (Signed)
Kristen Scott , Thank you for taking time to come for your Medicare Wellness Visit. I appreciate your ongoing commitment to your health goals. Please review the following plan we discussed and let me know if I can assist you in the future.   These are the goals we discussed:  Goals      Patient Stated     Would like to lose some weight, drink more water & stay active        This is a list of the screening recommended for you and due dates:  Health Maintenance  Topic Date Due   Mammogram  12/28/2022   Medicare Annual Wellness Visit  01/13/2023   Colon Cancer Screening  02/06/2026   DTaP/Tdap/Td vaccine (3 - Td or Tdap) 10/18/2027   Pneumonia Vaccine  Completed   Flu Shot  Completed   DEXA scan (bone density measurement)  Completed   COVID-19 Vaccine  Completed   Hepatitis C Screening: USPSTF Recommendation to screen - Ages 71-79 yo.  Completed   Zoster (Shingles) Vaccine  Completed   HPV Vaccine  Aged Out     Next appointment: Follow up in one year for your annual wellness visit.   Preventive Care 2 Years and Older, Female Preventive care refers to lifestyle choices and visits with your health care provider that can promote health and wellness. What does preventive care include? A yearly physical exam. This is also called an annual well check. Dental exams once or twice a year. Routine eye exams. Ask your health care provider how often you should have your eyes checked. Personal lifestyle choices, including: Daily care of your teeth and gums. Regular physical activity. Eating a healthy diet. Avoiding tobacco and drug use. Limiting alcohol use. Practicing safe sex. Taking low-dose aspirin every day. Taking vitamin and mineral supplements as recommended by your health care provider. What happens during an annual well check? The services and screenings done by your health care provider during your annual well check will depend on your age, overall health, lifestyle risk  factors, and family history of disease. Counseling  Your health care provider may ask you questions about your: Alcohol use. Tobacco use. Drug use. Emotional well-being. Home and relationship well-being. Sexual activity. Eating habits. History of falls. Memory and ability to understand (cognition). Work and work Astronomer. Reproductive health. Screening  You may have the following tests or measurements: Height, weight, and BMI. Blood pressure. Lipid and cholesterol levels. These may be checked every 5 years, or more frequently if you are over 28 years old. Skin check. Lung cancer screening. You may have this screening every year starting at age 70 if you have a 30-pack-year history of smoking and currently smoke or have quit within the past 15 years. Fecal occult blood test (FOBT) of the stool. You may have this test every year starting at age 32. Flexible sigmoidoscopy or colonoscopy. You may have a sigmoidoscopy every 5 years or a colonoscopy every 10 years starting at age 23. Hepatitis C blood test. Hepatitis B blood test. Sexually transmitted disease (STD) testing. Diabetes screening. This is done by checking your blood sugar (glucose) after you have not eaten for a while (fasting). You may have this done every 1-3 years. Bone density scan. This is done to screen for osteoporosis. You may have this done starting at age 35. Mammogram. This may be done every 1-2 years. Talk to your health care provider about how often you should have regular mammograms. Talk with your health care  provider about your test results, treatment options, and if necessary, the need for more tests. Vaccines  Your health care provider may recommend certain vaccines, such as: Influenza vaccine. This is recommended every year. Tetanus, diphtheria, and acellular pertussis (Tdap, Td) vaccine. You may need a Td booster every 10 years. Zoster vaccine. You may need this after age 67. Pneumococcal 13-valent  conjugate (PCV13) vaccine. One dose is recommended after age 25. Pneumococcal polysaccharide (PPSV23) vaccine. One dose is recommended after age 84. Talk to your health care provider about which screenings and vaccines you need and how often you need them. This information is not intended to replace advice given to you by your health care provider. Make sure you discuss any questions you have with your health care provider. Document Released: 02/19/2015 Document Revised: 10/13/2015 Document Reviewed: 11/24/2014 Elsevier Interactive Patient Education  2017 [employer] Prevention in the Home Falls can cause injuries. They can happen to people of all ages. There are many things you can do to make your home safe and to help prevent falls. What can I do on the outside of my home? Regularly fix the edges of walkways and driveways and fix any cracks. Remove anything that might make you trip as you walk through a door, such as a raised step or threshold. Trim any bushes or trees on the path to your home. Use bright outdoor lighting. Clear any walking paths of anything that might make someone trip, such as rocks or tools. Regularly check to see if handrails are loose or broken. Make sure that both sides of any steps have handrails. Any raised decks and porches should have guardrails on the edges. Have any leaves, snow, or ice cleared regularly. Use sand or salt on walking paths during winter. Clean up any spills in your garage right away. This includes oil or grease spills. What can I do in the bathroom? Use night lights. Install grab bars by the toilet and in the tub and shower. Do not use towel bars as grab bars. Use non-skid mats or decals in the tub or shower. If you need to sit down in the shower, use a plastic, non-slip stool. Keep the floor dry. Clean up any water that spills on the floor as soon as it happens. Remove soap buildup in the tub or shower regularly. Attach bath mats  securely with double-sided non-slip rug tape. Do not have throw rugs and other things on the floor that can make you trip. What can I do in the bedroom? Use night lights. Make sure that you have a light by your bed that is easy to reach. Do not use any sheets or blankets that are too big for your bed. They should not hang down onto the floor. Have a firm chair that has side arms. You can use this for support while you get dressed. Do not have throw rugs and other things on the floor that can make you trip. What can I do in the kitchen? Clean up any spills right away. Avoid walking on wet floors. Keep items that you use a lot in easy-to-reach places. If you need to reach something above you, use a strong step stool that has a grab bar. Keep electrical cords out of the way. Do not use floor polish or wax that makes floors slippery. If you must use wax, use non-skid floor wax. Do not have throw rugs and other things on the floor that can make you trip. What can I  do with my stairs? Do not leave any items on the stairs. Make sure that there are handrails on both sides of the stairs and use them. Fix handrails that are broken or loose. Make sure that handrails are as long as the stairways. Check any carpeting to make sure that it is firmly attached to the stairs. Fix any carpet that is loose or worn. Avoid having throw rugs at the top or bottom of the stairs. If you do have throw rugs, attach them to the floor with carpet tape. Make sure that you have a light switch at the top of the stairs and the bottom of the stairs. If you do not have them, ask someone to add them for you. What else can I do to help prevent falls? Wear shoes that: Do not have high heels. Have rubber bottoms. Are comfortable and fit you well. Are closed at the toe. Do not wear sandals. If you use a stepladder: Make sure that it is fully opened. Do not climb a closed stepladder. Make sure that both sides of the stepladder  are locked into place. Ask someone to hold it for you, if possible. Clearly mark and make sure that you can see: Any grab bars or handrails. First and last steps. Where the edge of each step is. Use tools that help you move around (mobility aids) if they are needed. These include: Canes. Walkers. Scooters. Crutches. Turn on the lights when you go into a dark area. Replace any light bulbs as soon as they burn out. Set up your furniture so you have a clear path. Avoid moving your furniture around. If any of your floors are uneven, fix them. If there are any pets around you, be aware of where they are. Review your medicines with your doctor. Some medicines can make you feel dizzy. This can increase your chance of falling. Ask your doctor what other things that you can do to help prevent falls. This information is not intended to replace advice given to you by your health care provider. Make sure you discuss any questions you have with your health care provider. Document Released: 11/19/2008 Document Revised: 07/01/2015 Document Reviewed: 02/27/2014 Elsevier Interactive Patient Education  2017 [employer].

## 2022-01-16 ENCOUNTER — Encounter: Payer: Self-pay | Admitting: Family Medicine

## 2022-01-16 DIAGNOSIS — E669 Obesity, unspecified: Secondary | ICD-10-CM

## 2022-01-24 MED ORDER — PEN NEEDLES 31G X 6 MM MISC: 3 refills | Status: DC

## 2022-01-24 MED ORDER — SAXENDA 18 MG/3ML ~~LOC~~ SOPN: 0.6000 mg | PEN_INJECTOR | Freq: Every day | SUBCUTANEOUS | 3 refills | Status: DC

## 2022-01-24 NOTE — Addendum Note (Signed)
Addended by: Abbe Amsterdam C on: 01/24/2022 07:14 PM   Modules accepted: Orders

## 2022-02-16 DIAGNOSIS — M5030 Other cervical disc degeneration, unspecified cervical region: Secondary | ICD-10-CM | POA: Diagnosis not present

## 2022-02-16 DIAGNOSIS — M5136 Other intervertebral disc degeneration, lumbar region: Secondary | ICD-10-CM | POA: Diagnosis not present

## 2022-02-16 DIAGNOSIS — G894 Chronic pain syndrome: Secondary | ICD-10-CM | POA: Diagnosis not present

## 2022-02-17 ENCOUNTER — Ambulatory Visit
Admission: RE | Admit: 2022-02-17 | Discharge: 2022-02-17 | Disposition: A | Discharge status: Home / Self Care | Payer: Medicare PPO | Source: Ambulatory Visit | Attending: Family Medicine | Admitting: Family Medicine

## 2022-02-17 DIAGNOSIS — Z1231 Encounter for screening mammogram for malignant neoplasm of breast: Secondary | ICD-10-CM | POA: Diagnosis not present

## 2022-02-22 DIAGNOSIS — F411 Generalized anxiety disorder: Secondary | ICD-10-CM | POA: Diagnosis not present

## 2022-02-27 ENCOUNTER — Encounter: Payer: Self-pay | Admitting: Family Medicine

## 2022-03-14 ENCOUNTER — Ambulatory Visit
Admission: EM | Admit: 2022-03-14 | Discharge: 2022-03-14 | Disposition: A | Discharge status: Home / Self Care | Payer: Medicare PPO

## 2022-03-14 DIAGNOSIS — H6123 Impacted cerumen, bilateral: Secondary | ICD-10-CM | POA: Diagnosis not present

## 2022-03-14 NOTE — ED Provider Notes (Signed)
[facility]    CSN: [id number] Arrival date & time: 03/14/22  0807      History   Chief Complaint Chief Complaint  Patient presents with   Otalgia    HPI LATORIA DRY is a 67 y.o. female.   HPI Very pleasant 67 year old female presents with right ear pain x 2 days.  Patient reports using OTC earwax drops to help with what she suspects excessive earwax-bilaterally.  PMH significant for obesity, chronic pain disorder, HTN, and HLD.  Past Medical History:  Diagnosis Date   Arthritis    Depression    DJD (degenerative joint disease)    left knee   GERD (gastroesophageal reflux disease)    Hearing loss, sensorineural    HTN (hypertension)    Hyperlipidemia    Migraine    Osteopenia     Patient Active Problem List   Diagnosis Date Noted   Osteopenia 12/08/2019   Carpal tunnel syndrome 09/30/2015   Pre-diabetes 10/05/2014   GERD (gastroesophageal reflux disease) 03/05/2014   Vitamin D deficiency 08/20/2011   Hyperlipidemia 08/20/2011   HTN (hypertension) 08/20/2011   Obesity, Class II, BMI 35-39.9 08/20/2011   Chronic pain disorder 08/20/2011   Depression 08/20/2011   DJD (degenerative joint disease) of knee 08/20/2011   Arthritis, low back 03/30/2011   Arthritis of neck ([place]) 03/30/2011    Past Surgical History:  Procedure Laterality Date   BREAST BIOPSY Right 05/25/2016   JOINT REPLACEMENT  2008   left knee    OB History   No obstetric history on file.      Home Medications    Prior to Admission medications   Medication Sig Start Date End Date Taking? Authorizing Provider  atorvastatin (LIPITOR) 40 MG tablet TAKE 1 TABLET(40 MG) BY MOUTH DAILY 08/03/21   Copland, Gay Filler, MD  cholecalciferol (VITAMIN D3) 25 MCG (1000 UNIT) tablet Take 1,000 Units by mouth daily.    [provider]  DULoxetine (CYMBALTA) 30 MG capsule Take 90 mg by mouth daily. 08/17/11   Barton Fanny, MD  HYDROcodone-acetaminophen (NORCO) 10-325 MG per  tablet Take 1 tablet by mouth 2 (two) times daily.    [provider]  Insulin Pen Needle (PEN NEEDLES) 31G X 6 MM MISC Use daily for saxenda injection 01/24/22   Copland, Gay Filler, MD  Liraglutide -Weight Management (SAXENDA) 18 MG/3ML SOPN Inject 0.6 mg into the skin daily. Use 0.6 mg for one week.  Then may increase by 0.6 mg weekly to max dose of 3 mg 01/24/22   Copland, Gay Filler, MD  metFORMIN (GLUCOPHAGE) 500 MG tablet Take 1 tablet (500 mg total) by mouth daily with breakfast. 10/26/21   Copland, Gay Filler, MD  metoprolol succinate (TOPROL-XL) 100 MG 24 hr tablet TAKE 1 TABLET(100 MG) BY MOUTH DAILY WITH OR IMMEDIATELY FOLLOWING A MEAL 07/28/21   Copland, Gay Filler, MD  omeprazole (PRILOSEC) 20 MG capsule Take 20 mg by mouth daily.    [provider]    Family History Family History  Problem Relation Age of Onset   Dementia Mother    Diabetes Father    Diabetes Brother    Mental illness Daughter        anxiety, OCD   Diabetes Sister    Diabetes Brother    Heart attack Maternal Grandfather     Social History Social History   Tobacco Use   Smoking status: Never   Smokeless tobacco: Never  Substance Use Topics   Alcohol  use: No   Drug use: No     Allergies   Relafen [nabumetone]   Review of Systems Review of Systems  HENT:  Positive for ear pain.   All other systems reviewed and are negative.    Physical Exam Triage Vital Signs ED Triage Vitals  Enc Vitals Group     BP      Pulse      Resp      Temp      Temp src      SpO2      Weight      Height      Head Circumference      Peak Flow      Pain Score      Pain Loc      Pain Edu?      Excl. in [place]?    No data found.  Updated Vital Signs BP (!) 158/108 (BP Location: Right Arm)   Pulse 92   Temp 98.3 F (36.8 C) (Oral)   Resp 16   LMP 03/29/2006   SpO2 95%     Physical Exam Vitals and nursing note reviewed.  Constitutional:      General: She is not in acute distress.     Appearance: Normal appearance. She is obese. She is not ill-appearing.  HENT:     Head: Normocephalic and atraumatic.     Right Ear: External ear normal.     Left Ear: External ear normal.     Ears:     Comments: Bilateral EACs are occluded with cerumen unable to visualize either TM; post bilateral ear lavage: Left TM-unable to visualize, left EAC-moderate cerumen remains; right TM-unable to visualize, right EAC-moderate cerumen remains; this after 3 attempts/ bottles ear lavage    Mouth/Throat:     Mouth: Mucous membranes are moist.     Pharynx: Oropharynx is clear.  Eyes:     Extraocular Movements: Extraocular movements intact.     Conjunctiva/sclera: Conjunctivae normal.     Pupils: Pupils are equal, round, and reactive to light.  Cardiovascular:     Rate and Rhythm: Normal rate and regular rhythm.     Pulses: Normal pulses.     Heart sounds: Normal heart sounds. No murmur heard. Pulmonary:     Effort: Pulmonary effort is normal.     Breath sounds: Normal breath sounds. No wheezing, rhonchi or rales.  Musculoskeletal:        General: Normal range of motion.     Cervical back: Normal range of motion and neck supple.  Skin:    General: Skin is warm and dry.  Neurological:     General: No focal deficit present.     Mental Status: She is alert and oriented to person, place, and time. Mental status is at baseline.      UC Treatments / Results  Labs (all labs ordered are listed, but only abnormal results are displayed) Labs Reviewed - No data to display  EKG   Radiology No results found.  Procedures Procedures (including critical care time)  Medications Ordered in UC Medications - No data to display  Initial Impression / Assessment and Plan / UC Course  I have reviewed the triage vital signs and the nursing notes.  Pertinent labs & imaging results that were available during my care of the patient were reviewed by me and considered in my medical decision making (see  chart for details).     MDM: 1.  Bilateral impacted cerumen-3 attempts  made with bilateral lavage without success while removing some cerumen. Advised patient to avoid using Q-tips to clean ears.  Advised may use OTC Debrox 4 drops into each ear twice daily for the next 5 to 7 days.   Advised if symptoms worsen and/or unresolved please follow-up with PCP or ENT for further evaluation.  Patient discharged home, hemodynamically stable. Final Clinical Impressions(s) / UC Diagnoses   Final diagnoses:  Bilateral impacted cerumen     Discharge Instructions      Advised patient to avoid using Q-tips to clean ears.  Advised may use OTC Debrox 4 drops into each ear twice daily for the next 5 to 7 days.  Advised if symptoms worsen and/or unresolved please follow-up with PCP or ENT for further evaluation.     ED Prescriptions   None    PDMP not reviewed this encounter.   Eliezer Lofts, [place] 03/14/22 [phone]

## 2022-03-14 NOTE — Discharge Instructions (Addendum)
Advised patient to avoid using Q-tips to clean ears.  Advised may use OTC Debrox 4 drops into each ear twice daily for the next 5 to 7 days.  Advised if symptoms worsen and/or unresolved please follow-up with PCP or ENT for further evaluation.

## 2022-03-14 NOTE — ED Triage Notes (Signed)
Pt states right ear pain for the past 2 days.

## 2022-03-15 ENCOUNTER — Telehealth: Payer: Self-pay

## 2022-03-19 NOTE — Progress Notes (Unsigned)
[facility] [facility] [address], [place], [place] [address] [phone]  Date:  03/20/2022   Name:  Kristen Scott   DOB:  [birth year]   MRN:  [id number]  PCP:  Darreld Mclean, MD    Chief Complaint: No chief complaint on file.   History of Present Illness:  Kristen Scott is a 67 y.o. very pleasant female patient who presents with the following:  Patient seen today with concern of needing her ears irrigated Most recent visit with myself was in October- history of hypertension, prediabetes, hyperlipidemia, chronic arthritis pain, obesity  She was seen at urgent care on 2/6 at which point she underwent some ear lavage but wax was not able to be completely removed.  She was asked to treat with over-the-counter wax softening drops and then follow-up with her primary care  She has 2 adult children, 2 grandchildren We have been trying to get her Saxenda for weight loss, but she has had some difficulty getting this medication due to supply issues  Lab Results  Component Value Date   HGBA1C 6.1 08/03/2021    Patient Active Problem List   Diagnosis Date Noted   Osteopenia 12/08/2019   Carpal tunnel syndrome 09/30/2015   Pre-diabetes 10/05/2014   GERD (gastroesophageal reflux disease) 03/05/2014   Vitamin D deficiency 08/20/2011   Hyperlipidemia 08/20/2011   HTN (hypertension) 08/20/2011   Obesity, Class II, BMI 35-39.9 08/20/2011   Chronic pain disorder 08/20/2011   Depression 08/20/2011   DJD (degenerative joint disease) of knee 08/20/2011   Arthritis, low back 03/30/2011   Arthritis of neck ([place]) 03/30/2011    Past Medical History:  Diagnosis Date   Arthritis    Depression    DJD (degenerative joint disease)    left knee   GERD (gastroesophageal reflux disease)    Hearing loss, sensorineural    HTN (hypertension)    Hyperlipidemia    Migraine    Osteopenia     Past Surgical History:  Procedure Laterality  Date   BREAST BIOPSY Right 05/25/2016   JOINT REPLACEMENT  2008   left knee    Social History   Tobacco Use   Smoking status: Never   Smokeless tobacco: Never  Substance Use Topics   Alcohol use: No   Drug use: No    Family History  Problem Relation Age of Onset   Dementia Mother    Diabetes Father    Diabetes Brother    Mental illness Daughter        anxiety, OCD   Diabetes Sister    Diabetes Brother    Heart attack Maternal Grandfather     Allergies  Allergen Reactions   Relafen [Nabumetone] Rash    Medication list has been reviewed and updated.  Current Outpatient Medications on File Prior to Visit  Medication Sig Dispense Refill   atorvastatin (LIPITOR) 40 MG tablet TAKE 1 TABLET(40 MG) BY MOUTH DAILY 90 tablet 3   cholecalciferol (VITAMIN D3) 25 MCG (1000 UNIT) tablet Take 1,000 Units by mouth daily.     DULoxetine (CYMBALTA) 30 MG capsule Take 90 mg by mouth daily.     HYDROcodone-acetaminophen (NORCO) 10-325 MG per tablet Take 1 tablet by mouth 2 (two) times daily.     Insulin Pen Needle (PEN NEEDLES) 31G X 6 MM MISC Use daily for saxenda injection 100 each 3   Liraglutide -Weight Management (SAXENDA) 18 MG/3ML SOPN Inject 0.6 mg into  the skin daily. Use 0.6 mg for one week.  Then may increase by 0.6 mg weekly to max dose of 3 mg 18 mL 3   metFORMIN (GLUCOPHAGE) 500 MG tablet Take 1 tablet (500 mg total) by mouth daily with breakfast. 90 tablet 1   metoprolol succinate (TOPROL-XL) 100 MG 24 hr tablet TAKE 1 TABLET(100 MG) BY MOUTH DAILY WITH OR IMMEDIATELY FOLLOWING A MEAL 90 tablet 3   omeprazole (PRILOSEC) 20 MG capsule Take 20 mg by mouth daily.     No current facility-administered medications on file prior to visit.    Review of Systems:  As per HPI- otherwise negative.   Physical Examination: There were no vitals filed for this visit. There were no vitals filed for this visit. There is no height or weight on file to calculate BMI. Ideal Body  Weight:    GEN: no acute distress. HEENT: Atraumatic, Normocephalic.  Ears and Nose: No external deformity. CV: RRR, No M/G/R. No JVD. No thrill. No extra heart sounds. PULM: CTA B, no wheezes, crackles, rhonchi. No retractions. No resp. distress. No accessory muscle use. ABD: S, NT, ND, +BS. No rebound. No HSM. EXTR: No c/c/e PSYCH: Normally interactive. Conversant.    Assessment and Plan: ***  Signed Lamar Blinks, MD

## 2022-03-20 ENCOUNTER — Encounter: Payer: Self-pay | Admitting: Family Medicine

## 2022-03-20 ENCOUNTER — Ambulatory Visit (INDEPENDENT_AMBULATORY_CARE_PROVIDER_SITE_OTHER): Payer: Medicare PPO | Admitting: Family Medicine

## 2022-03-20 VITALS — BP 124/68 | HR 82 | Ht 62.0 in

## 2022-03-20 DIAGNOSIS — E669 Obesity, unspecified: Secondary | ICD-10-CM

## 2022-03-20 DIAGNOSIS — H6123 Impacted cerumen, bilateral: Secondary | ICD-10-CM | POA: Diagnosis not present

## 2022-03-20 NOTE — Patient Instructions (Signed)
Can start on Ozempic while you wait to get [facility] (they are the same drug) Use 0.25 mg weekly for 4 weeks, then go up to 0.5 mg weekly Let me know if any problems with your ears

## 2022-04-05 DIAGNOSIS — H6123 Impacted cerumen, bilateral: Secondary | ICD-10-CM | POA: Diagnosis not present

## 2022-04-05 DIAGNOSIS — H9193 Unspecified hearing loss, bilateral: Secondary | ICD-10-CM | POA: Diagnosis not present

## 2022-04-10 DIAGNOSIS — L570 Actinic keratosis: Secondary | ICD-10-CM | POA: Diagnosis not present

## 2022-04-10 DIAGNOSIS — L814 Other melanin hyperpigmentation: Secondary | ICD-10-CM | POA: Diagnosis not present

## 2022-05-08 ENCOUNTER — Encounter: Payer: Self-pay | Admitting: Family Medicine

## 2022-05-08 MED ORDER — WEGOVY 1 MG/0.5ML ~~LOC~~ SOAJ: 1.0000 mg | SUBCUTANEOUS | 3 refills | Status: DC

## 2022-05-08 NOTE — Addendum Note (Signed)
Addended by: Lamar Blinks C on: 05/08/2022 01:03 PM   Modules accepted: Orders

## 2022-05-10 ENCOUNTER — Telehealth: Payer: Self-pay

## 2022-05-10 DIAGNOSIS — M25551 Pain in right hip: Secondary | ICD-10-CM | POA: Diagnosis not present

## 2022-05-10 NOTE — Telephone Encounter (Signed)
PA initiated via Covermymeds; KEY: [id number]. Awaiting determination.

## 2022-05-12 ENCOUNTER — Encounter: Payer: Self-pay | Admitting: Family Medicine

## 2022-05-12 NOTE — Telephone Encounter (Signed)
PA denied.   The Medicare rule in the Prescription Drug Manual (Chapter 6, Section 20.1) says drugs used to help you lose weight are excluded from Medicare Part D coverage. [employer] follows Medicare rules. The information we have about your case says your drug is being used for weight loss and per Medicare rules isnt covered.

## 2022-06-01 ENCOUNTER — Encounter: Payer: Self-pay | Admitting: Family Medicine

## 2022-06-01 MED ORDER — ZEPBOUND 2.5 MG/0.5ML ~~LOC~~ SOAJ: 2.5000 mg | SUBCUTANEOUS | 2 refills | Status: DC

## 2022-06-13 DIAGNOSIS — L570 Actinic keratosis: Secondary | ICD-10-CM | POA: Diagnosis not present

## 2022-06-19 DIAGNOSIS — Z79891 Long term (current) use of opiate analgesic: Secondary | ICD-10-CM | POA: Diagnosis not present

## 2022-06-19 DIAGNOSIS — M542 Cervicalgia: Secondary | ICD-10-CM | POA: Diagnosis not present

## 2022-07-07 ENCOUNTER — Encounter: Payer: Self-pay | Admitting: Family Medicine

## 2022-07-07 MED ORDER — ZEPBOUND 5 MG/0.5ML ~~LOC~~ SOAJ: 5.0000 mg | SUBCUTANEOUS | 1 refills | Status: DC

## 2022-07-29 ENCOUNTER — Other Ambulatory Visit: Payer: Self-pay | Admitting: Family Medicine

## 2022-07-31 MED ORDER — ZEPBOUND 7.5 MG/0.5ML ~~LOC~~ SOAJ: 7.5000 mg | SUBCUTANEOUS | 0 refills | Status: DC

## 2022-08-14 ENCOUNTER — Encounter: Payer: Self-pay | Admitting: Family Medicine

## 2022-08-14 DIAGNOSIS — I10 Essential (primary) hypertension: Secondary | ICD-10-CM

## 2022-08-14 MED ORDER — METOPROLOL SUCCINATE ER 100 MG PO TB24: 100.0000 mg | ORAL_TABLET | Freq: Every day | ORAL | 0 refills | Status: DC

## 2022-08-16 DIAGNOSIS — F411 Generalized anxiety disorder: Secondary | ICD-10-CM | POA: Diagnosis not present

## 2022-08-26 ENCOUNTER — Other Ambulatory Visit: Payer: Self-pay | Admitting: Family Medicine

## 2022-08-28 DIAGNOSIS — Z86010 Personal history of colonic polyps: Secondary | ICD-10-CM | POA: Diagnosis not present

## 2022-08-28 DIAGNOSIS — K219 Gastro-esophageal reflux disease without esophagitis: Secondary | ICD-10-CM | POA: Diagnosis not present

## 2022-08-30 ENCOUNTER — Other Ambulatory Visit: Payer: Self-pay | Admitting: Family Medicine

## 2022-08-31 MED ORDER — ZEPBOUND 10 MG/0.5ML ~~LOC~~ SOAJ: 10.0000 mg | SUBCUTANEOUS | 0 refills | Status: DC

## 2022-09-04 NOTE — Patient Instructions (Signed)
It was great to see you again today, I will be in touch with your labs as soon as possible  Lets decrease your metoprolol to 75 mg total, can take all at once.  If your blood pressure continues to run lower than about 120/75, go ahead and decrease again to 50 mg  I gave you some prednisone to try for your hip pain, I hope it is helpful  Recommend flu shot, COVID booster this fall

## 2022-09-04 NOTE — Progress Notes (Unsigned)
[facility] at [facility] [address], [address] [place], [place] [address] [phone]  Date:  09/06/2022   Name:  Kristen Scott   DOB:  07/09/1955   MRN:  [id number]  PCP:  Pearline Cables, MD    Chief Complaint: No chief complaint on file.   History of Present Illness:  Kristen Scott is a 67 y.o. very pleasant female patient who presents with the following:  Patient seen today for physical exam-  history of hypertension, prediabetes, hyperlipidemia, chronic arthritis pain, obesity  Most recent visit with myself was in February of this year We got her started on a GLP-1 most recently she has been taking zepbound 7.5  Mammogram up-to-date Colon cancer screening not due yet Bone density completed last fall Can update lab work today Patient Active Problem List   Diagnosis Date Noted   Osteopenia 12/08/2019   Carpal tunnel syndrome 09/30/2015   Pre-diabetes 10/05/2014   GERD (gastroesophageal reflux disease) 03/05/2014   Vitamin D deficiency 08/20/2011   Hyperlipidemia 08/20/2011   HTN (hypertension) 08/20/2011   Obesity, Class II, BMI 35-39.9 08/20/2011   Chronic pain disorder 08/20/2011   Depression 08/20/2011   DJD (degenerative joint disease) of knee 08/20/2011   Arthritis, low back 03/30/2011   Arthritis of neck (HCC) 03/30/2011    Past Medical History:  Diagnosis Date   Arthritis    Depression    DJD (degenerative joint disease)    left knee   GERD (gastroesophageal reflux disease)    Hearing loss, sensorineural    HTN (hypertension)    Hyperlipidemia    Migraine    Osteopenia     Past Surgical History:  Procedure Laterality Date   BREAST BIOPSY Right 05/25/2016   JOINT REPLACEMENT  2008   left knee    Social History   Tobacco Use   Smoking status: Never   Smokeless tobacco: Never  Substance Use Topics   Alcohol use: No   Drug use: No    Family History  Problem Relation Age of Onset   Dementia  Mother    Diabetes Father    Diabetes Brother    Mental illness Daughter        anxiety, OCD   Diabetes Sister    Diabetes Brother    Heart attack Maternal Grandfather     Allergies  Allergen Reactions   Relafen [Nabumetone] Rash    Medication list has been reviewed and updated.  Current Outpatient Medications on File Prior to Visit  Medication Sig Dispense Refill   atorvastatin (LIPITOR) 40 MG tablet TAKE 1 TABLET(40 MG) BY MOUTH DAILY 90 tablet 3   cholecalciferol (VITAMIN D3) 25 MCG (1000 UNIT) tablet Take 1,000 Units by mouth daily.     DULoxetine (CYMBALTA) 30 MG capsule Take 90 mg by mouth daily.     HYDROcodone-acetaminophen (NORCO) 10-325 MG per tablet Take 1 tablet by mouth 2 (two) times daily.     Insulin Pen Needle (PEN NEEDLES) 31G X 6 MM MISC Use daily for saxenda injection 100 each 3   metFORMIN (GLUCOPHAGE) 500 MG tablet Take 1 tablet (500 mg total) by mouth daily with breakfast. 90 tablet 1   metoprolol succinate (TOPROL-XL) 100 MG 24 hr tablet Take 1 tablet (100 mg total) by mouth daily. Take with or immediately following a meal. 90 tablet 0   omeprazole (PRILOSEC) 20 MG capsule Take 20 mg by mouth daily.     tirzepatide (ZEPBOUND)  10 MG/0.5ML Pen Inject 10 mg into the skin once a week. 2 mL 0   No current facility-administered medications on file prior to visit.    Review of Systems:  As per HPI- otherwise negative.   Physical Examination: There were no vitals filed for this visit. There were no vitals filed for this visit. There is no height or weight on file to calculate BMI. Ideal Body Weight:    GEN: no acute distress. HEENT: Atraumatic, Normocephalic.  Ears and Nose: No external deformity. CV: RRR, No M/G/R. No JVD. No thrill. No extra heart sounds. PULM: CTA B, no wheezes, crackles, rhonchi. No retractions. No resp. distress. No accessory muscle use. ABD: S, NT, ND, +BS. No rebound. No HSM. EXTR: No c/c/e PSYCH: Normally interactive.  Conversant.    Assessment and Plan: ***  Signed Abbe Amsterdam, MD

## 2022-09-06 ENCOUNTER — Ambulatory Visit (INDEPENDENT_AMBULATORY_CARE_PROVIDER_SITE_OTHER): Payer: Medicare PPO | Admitting: Family Medicine

## 2022-09-06 VITALS — BP 108/60 | HR 83 | Temp 97.7°F | Resp 18 | Ht 62.0 in | Wt 173.4 lb

## 2022-09-06 DIAGNOSIS — R7303 Prediabetes: Secondary | ICD-10-CM

## 2022-09-06 DIAGNOSIS — M25551 Pain in right hip: Secondary | ICD-10-CM | POA: Diagnosis not present

## 2022-09-06 DIAGNOSIS — E559 Vitamin D deficiency, unspecified: Secondary | ICD-10-CM | POA: Diagnosis not present

## 2022-09-06 DIAGNOSIS — Z130 Encounter for screening for diseases of the blood and blood-forming organs and certain disorders involving the immune mechanism: Secondary | ICD-10-CM | POA: Diagnosis not present

## 2022-09-06 DIAGNOSIS — I10 Essential (primary) hypertension: Secondary | ICD-10-CM | POA: Diagnosis not present

## 2022-09-06 DIAGNOSIS — Z0000 Encounter for general adult medical examination without abnormal findings: Secondary | ICD-10-CM | POA: Diagnosis not present

## 2022-09-06 DIAGNOSIS — E785 Hyperlipidemia, unspecified: Secondary | ICD-10-CM

## 2022-09-06 DIAGNOSIS — Z1329 Encounter for screening for other suspected endocrine disorder: Secondary | ICD-10-CM

## 2022-09-06 MED ORDER — METOPROLOL SUCCINATE ER 25 MG PO TB24: 75.0000 mg | ORAL_TABLET | Freq: Every day | ORAL | 3 refills | Status: DC

## 2022-09-06 MED ORDER — PREDNISONE 20 MG PO TABS: ORAL_TABLET | ORAL | 0 refills | Status: DC

## 2022-09-07 ENCOUNTER — Encounter: Payer: Self-pay | Admitting: Family Medicine

## 2022-09-07 NOTE — Addendum Note (Signed)
Addended by: Abbe Amsterdam C on: 09/07/2022 05:29 PM   Modules accepted: Orders

## 2022-09-26 ENCOUNTER — Other Ambulatory Visit: Payer: Self-pay | Admitting: Family Medicine

## 2022-09-27 MED ORDER — ZEPBOUND 12.5 MG/0.5ML ~~LOC~~ SOAJ: 12.5000 mg | SUBCUTANEOUS | 0 refills | Status: DC

## 2022-09-28 DIAGNOSIS — H6123 Impacted cerumen, bilateral: Secondary | ICD-10-CM | POA: Diagnosis not present

## 2022-10-01 ENCOUNTER — Other Ambulatory Visit: Payer: Self-pay | Admitting: Family Medicine

## 2022-10-01 DIAGNOSIS — E785 Hyperlipidemia, unspecified: Secondary | ICD-10-CM

## 2022-10-13 ENCOUNTER — Encounter: Payer: Self-pay | Admitting: Family Medicine

## 2022-10-13 ENCOUNTER — Other Ambulatory Visit (INDEPENDENT_AMBULATORY_CARE_PROVIDER_SITE_OTHER): Payer: Medicare PPO

## 2022-10-13 DIAGNOSIS — I10 Essential (primary) hypertension: Secondary | ICD-10-CM | POA: Diagnosis not present

## 2022-10-13 LAB — BASIC METABOLIC PANEL
BUN: 33 mg/dL — ABNORMAL HIGH (ref 6–23)
CO2: 29 meq/L (ref 19–32)
Calcium: 9.6 mg/dL (ref 8.4–10.5)
Chloride: 102 meq/L (ref 96–112)
Creatinine, Ser: 1 mg/dL (ref 0.40–1.20)
GFR: 57.63 mL/min — ABNORMAL LOW (ref >60.00)
Glucose, Bld: 87 mg/dL (ref 70–99)
Potassium: 4.5 meq/L (ref 3.5–5.1)
Sodium: 139 mEq/L (ref 135–145)

## 2022-10-19 DIAGNOSIS — M542 Cervicalgia: Secondary | ICD-10-CM | POA: Diagnosis not present

## 2022-10-19 DIAGNOSIS — G894 Chronic pain syndrome: Secondary | ICD-10-CM | POA: Diagnosis not present

## 2022-10-19 DIAGNOSIS — Z5181 Encounter for therapeutic drug level monitoring: Secondary | ICD-10-CM | POA: Diagnosis not present

## 2022-10-19 DIAGNOSIS — Z79899 Other long term (current) drug therapy: Secondary | ICD-10-CM | POA: Diagnosis not present

## 2022-10-19 DIAGNOSIS — M5136 Other intervertebral disc degeneration, lumbar region: Secondary | ICD-10-CM | POA: Diagnosis not present

## 2022-10-19 DIAGNOSIS — Z79891 Long term (current) use of opiate analgesic: Secondary | ICD-10-CM | POA: Diagnosis not present

## 2022-10-21 ENCOUNTER — Other Ambulatory Visit: Payer: Self-pay | Admitting: Family Medicine

## 2022-10-21 DIAGNOSIS — I10 Essential (primary) hypertension: Secondary | ICD-10-CM

## 2022-10-24 ENCOUNTER — Other Ambulatory Visit: Payer: Self-pay | Admitting: Family Medicine

## 2022-10-25 MED ORDER — ZEPBOUND 15 MG/0.5ML ~~LOC~~ SOAJ: 15.0000 mg | SUBCUTANEOUS | 3 refills | Status: DC

## 2022-11-06 ENCOUNTER — Encounter (INDEPENDENT_AMBULATORY_CARE_PROVIDER_SITE_OTHER): Payer: Medicare PPO | Admitting: Family Medicine

## 2022-11-06 DIAGNOSIS — R110 Nausea: Secondary | ICD-10-CM

## 2022-11-06 MED ORDER — ONDANSETRON HCL 4 MG PO TABS: 4.0000 mg | ORAL_TABLET | Freq: Three times a day (TID) | ORAL | 0 refills | Status: DC | PRN

## 2022-11-06 NOTE — Addendum Note (Signed)
Addended by: Abbe Amsterdam C on: 11/06/2022 04:54 PM   Modules accepted: Orders

## 2022-11-06 NOTE — Telephone Encounter (Signed)
Please see the MyChart message reply(ies) for my assessment and plan.  The patient gave consent for this Medical Advice Message and is aware that it may result in a bill to their insurance company as well as the possibility that this may result in a co-payment or deductible. They are an established patient, but are not seeking medical advice exclusively about a problem treated during an in person or video visit in the last 7 days. I did not recommend an in person or video visit within 7 days of my reply.  I spent a total of 10 minutes cumulative time within 7 days through MyChart messaging Taiven Greenley, MD  

## 2022-11-19 ENCOUNTER — Encounter: Payer: Self-pay | Admitting: Family Medicine

## 2022-11-20 NOTE — Telephone Encounter (Signed)
I entered the immunizations. Noticed that this is her second RSV shot. Will she need anymore?

## 2022-12-22 ENCOUNTER — Other Ambulatory Visit: Payer: Self-pay | Admitting: Family Medicine

## 2022-12-22 DIAGNOSIS — Z1231 Encounter for screening mammogram for malignant neoplasm of breast: Secondary | ICD-10-CM

## 2023-01-10 ENCOUNTER — Other Ambulatory Visit: Payer: Self-pay | Admitting: Family Medicine

## 2023-01-10 DIAGNOSIS — E785 Hyperlipidemia, unspecified: Secondary | ICD-10-CM

## 2023-01-16 ENCOUNTER — Telehealth: Payer: Self-pay

## 2023-01-16 NOTE — Telephone Encounter (Signed)
Initial Comment The caller reports lightheadedness and winded going up stairs. Translation No Disp. Time Lamount Cohen Time) Disposition Final User 01/16/2023 2:41:59 PM Attempt made - message left Maralyn Sago 01/16/2023 2:54:49 PM Attempt made - no message left Maralyn Sago 01/16/2023 3:09:04 PM FINAL ATTEMPT MADE - no message left Yes Daphine Deutscher RN, Melanie Final Disposition 01/16/2023 3:09:04 PM FINAL ATTEMPT MADE - no message left Yes Daphine Deutscher, RN, [employer]

## 2023-01-16 NOTE — Progress Notes (Unsigned)
[facility] [address], [address] [place], [place] [address] [phone]  Date:  01/17/2023   Name:  CHONA MOOTHART   DOB:  [birth year]   MRN:  [id number]  PCP:  Pearline Cables, MD    Chief Complaint: No chief complaint on file.   History of Present Illness:  YADHIRA HELDRETH is a 67 y.o. very pleasant female patient who presents with the following:  Patient seen today for follow-up, monitoring of chronic health conditions.  It looks like she also had complained of feeling short of breath recently Most recent visit with myself was in July for physical- history of hypertension, prediabetes, hyperlipidemia, chronic arthritis pain, obesity  At our last visit she was on 7.5 mg of Zepbound and had lost weight-we decreased her dose of Toprol XL due to weight loss associated improvement blood pressure  Flu shot is up-to-date Mammogram is scheduled for January COVID booster Up-to-date on Shingrix and RSV  Lab Results  Component Value Date   HGBA1C 5.6 09/06/2022     Patient Active Problem List   Diagnosis Date Noted   Osteopenia 12/08/2019   Carpal tunnel syndrome 09/30/2015   Pre-diabetes 10/05/2014   GERD (gastroesophageal reflux disease) 03/05/2014   Vitamin D deficiency 08/20/2011   Hyperlipidemia 08/20/2011   HTN (hypertension) 08/20/2011   Obesity, Class II, BMI 35-39.9 08/20/2011   Chronic pain disorder 08/20/2011   Depression 08/20/2011   DJD (degenerative joint disease) of knee 08/20/2011   Arthritis, low back 03/30/2011   Arthritis of neck (HCC) 03/30/2011    Past Medical History:  Diagnosis Date   Arthritis    Depression    DJD (degenerative joint disease)    left knee   GERD (gastroesophageal reflux disease)    Hearing loss, sensorineural    HTN (hypertension)    Hyperlipidemia    Migraine    Osteopenia     Past Surgical History:  Procedure Laterality Date   BREAST BIOPSY Right 05/25/2016    JOINT REPLACEMENT  2008   left knee    Social History   Tobacco Use   Smoking status: Never   Smokeless tobacco: Never  Substance Use Topics   Alcohol use: No   Drug use: No    Family History  Problem Relation Age of Onset   Dementia Mother    Diabetes Father    Diabetes Brother    Mental illness Daughter        anxiety, OCD   Diabetes Sister    Diabetes Brother    Heart attack Maternal Grandfather     Allergies  Allergen Reactions   Relafen [Nabumetone] Rash    Medication list has been reviewed and updated.  Current Outpatient Medications on File Prior to Visit  Medication Sig Dispense Refill   atorvastatin (LIPITOR) 40 MG tablet Take 1 tablet (40 mg total) by mouth daily. 90 tablet 0   cholecalciferol (VITAMIN D3) 25 MCG (1000 UNIT) tablet Take 1,000 Units by mouth daily.     DULoxetine (CYMBALTA) 30 MG capsule Take 90 mg by mouth daily.     HYDROcodone-acetaminophen (NORCO) 10-325 MG per tablet Take 1 tablet by mouth 2 (two) times daily.     metFORMIN (GLUCOPHAGE) 500 MG tablet Take 1 tablet (500 mg total) by mouth daily with breakfast. 90 tablet 1   metoprolol succinate (TOPROL-XL) 25 MG 24 hr tablet Take 3 tablets (75 mg total) by mouth daily. 270 tablet 3  omeprazole (PRILOSEC) 20 MG capsule Take 20 mg by mouth daily.     ondansetron (ZOFRAN) 4 MG tablet Take 1 tablet (4 mg total) by mouth every 8 (eight) hours as needed for nausea or vomiting. 20 tablet 0   predniSONE (DELTASONE) 20 MG tablet Take 40mg  by mouth daily for 3 days, then 20 mg by mouth daily for 3 days 9 tablet 0   tirzepatide (ZEPBOUND) 15 MG/0.5ML Pen Inject 15 mg into the skin once a week. 2 mL 3   No current facility-administered medications on file prior to visit.    Review of Systems:  As per HPI- otherwise negative.   Physical Examination: There were no vitals filed for this visit. There were no vitals filed for this visit. There is no height or weight on file to calculate  BMI. Ideal Body Weight:    GEN: no acute distress. HEENT: Atraumatic, Normocephalic.  Ears and Nose: No external deformity. CV: RRR, No M/G/R. No JVD. No thrill. No extra heart sounds. PULM: CTA B, no wheezes, crackles, rhonchi. No retractions. No resp. distress. No accessory muscle use. ABD: S, NT, ND, +BS. No rebound. No HSM. EXTR: No c/c/e PSYCH: Normally interactive. Conversant.    Assessment and Plan: ***  Signed Abbe Amsterdam, MD

## 2023-01-17 ENCOUNTER — Ambulatory Visit: Payer: Medicare PPO | Admitting: *Deleted

## 2023-01-17 ENCOUNTER — Encounter: Payer: Self-pay | Admitting: Family Medicine

## 2023-01-17 ENCOUNTER — Ambulatory Visit (HOSPITAL_BASED_OUTPATIENT_CLINIC_OR_DEPARTMENT_OTHER)
Admission: RE | Admit: 2023-01-17 | Discharge: 2023-01-17 | Disposition: A | Discharge status: Home / Self Care | Payer: Medicare PPO | Source: Ambulatory Visit | Attending: Family Medicine | Admitting: Family Medicine

## 2023-01-17 ENCOUNTER — Ambulatory Visit: Payer: Medicare PPO | Admitting: Family Medicine

## 2023-01-17 VITALS — BP 110/70 | HR 103 | Temp 97.6°F | Resp 18 | Ht 62.0 in | Wt 144.4 lb

## 2023-01-17 DIAGNOSIS — I10 Essential (primary) hypertension: Secondary | ICD-10-CM | POA: Diagnosis not present

## 2023-01-17 DIAGNOSIS — R0602 Shortness of breath: Secondary | ICD-10-CM | POA: Diagnosis not present

## 2023-01-17 DIAGNOSIS — Z0000 Encounter for general adult medical examination without abnormal findings: Secondary | ICD-10-CM

## 2023-01-17 DIAGNOSIS — E669 Obesity, unspecified: Secondary | ICD-10-CM | POA: Diagnosis not present

## 2023-01-17 MED ORDER — ZEPBOUND 12.5 MG/0.5ML ~~LOC~~ SOAJ: 12.5000 mg | SUBCUTANEOUS | 1 refills | Status: DC

## 2023-01-17 NOTE — Progress Notes (Signed)
Subjective:   Kristen Scott is a 68 y.o. female who presents for Medicare Annual (Subsequent) preventive examination.  Visit Complete: Virtual I connected with  Regino Schultze on 01/12/23 by a audio enabled telemedicine application and verified that I am speaking with the correct person using two identifiers.  Patient Location: Home  Provider Location: Office/Clinic  I discussed the limitations of evaluation and management by telemedicine. The patient expressed understanding and agreed to proceed.  Vital Signs: Because this visit was a virtual/telehealth visit, some criteria may be missing or patient reported. Any vitals not documented were not able to be obtained and vitals that have been documented are patient reported.  Patient Medicare AWV questionnaire was completed by the patient on 01/12/23; I have confirmed that all information answered by patient is correct and no changes since this date.  Cardiac Risk Factors include: advanced age (>43men, >85 women);dyslipidemia;hypertension     Objective:    There were no vitals filed for this visit. There is no height or weight on file to calculate BMI.     01/17/2023    8:24 AM 01/12/2022    8:31 AM 01/10/2021    8:24 AM  Advanced Directives  Does Patient Have a Medical Advance Directive? Yes Yes Yes  Type of Estate agent of [place];Living will Healthcare Power of [place];Living will Living will  Does patient want to make changes to medical advance directive? No - Patient declined    Copy of Healthcare Power of Attorney in Chart? No - copy requested No - copy requested     Current Medications (verified) Outpatient Encounter Medications as of 01/17/2023  Medication Sig   atorvastatin (LIPITOR) 40 MG tablet Take 1 tablet (40 mg total) by mouth daily.   cholecalciferol (VITAMIN D3) 25 MCG (1000 UNIT) tablet Take 1,000 Units by mouth daily.   DULoxetine (CYMBALTA) 30 MG capsule Take 90 mg by mouth daily.    HYDROcodone-acetaminophen (NORCO) 10-325 MG per tablet Take 1 tablet by mouth 2 (two) times daily.   metFORMIN (GLUCOPHAGE) 500 MG tablet Take 1 tablet (500 mg total) by mouth daily with breakfast.   metoprolol succinate (TOPROL-XL) 25 MG 24 hr tablet Take 3 tablets (75 mg total) by mouth daily.   omeprazole (PRILOSEC) 20 MG capsule Take 20 mg by mouth daily.   tirzepatide (ZEPBOUND) 15 MG/0.5ML Pen Inject 15 mg into the skin once a week.   [DISCONTINUED] ondansetron (ZOFRAN) 4 MG tablet Take 1 tablet (4 mg total) by mouth every 8 (eight) hours as needed for nausea or vomiting.   [DISCONTINUED] predniSONE (DELTASONE) 20 MG tablet Take 40mg  by mouth daily for 3 days, then 20 mg by mouth daily for 3 days   No facility-administered encounter medications on file as of 01/17/2023.    Allergies (verified) Relafen [nabumetone]   History: Past Medical History:  Diagnosis Date   Allergy 2007   A rash from anti inflammatory medicine   Arthritis    Depression    DJD (degenerative joint disease)    left knee   GERD (gastroesophageal reflux disease)    Hearing loss, sensorineural    HTN (hypertension)    Hyperlipidemia    Migraine    Osteopenia    Past Surgical History:  Procedure Laterality Date   BREAST BIOPSY Right 05/25/2016   JOINT REPLACEMENT  2008   left knee   Family History  Problem Relation Age of Onset   Dementia Mother    Early death Mother  Hypertension Mother    Kidney disease Mother    Diabetes Father    Arthritis Father    Early death Father    Vision loss Father    Diabetes Brother    Stroke Brother    Mental illness Daughter        anxiety, OCD   Anxiety disorder Daughter    Diabetes Sister    Arthritis Sister    Obesity Sister    Diabetes Brother    Arthritis Brother    Heart attack Maternal Grandfather    Hearing loss Paternal Aunt    Obesity Paternal Aunt    Social History   Socioeconomic History   Marital status: Divorced    Spouse name: n/a    Number of children: 2   Years of education: Not on file   Highest education level: Bachelor's degree (e.g., BA, AB, BS)  Occupational History   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   Lives alone. Her children live in [place], [place] and [place], [place].   Social Determinants of Health   Financial Resource Strain: Low Risk  (01/16/2023)   Overall Financial Resource Strain (CARDIA)    Difficulty of Paying Living Expenses: Not very hard  Food Insecurity: No Food Insecurity (01/16/2023)   Hunger Vital Sign    Worried About Running Out of Food in the Last Year: Never true    Ran Out of Food in the Last Year: Never true  Transportation Needs: No Transportation Needs (01/16/2023)   PRAPARE - Administrator, Civil Service (Medical): No    Lack of Transportation (Non-Medical): No  Physical Activity: Insufficiently Active (01/16/2023)   Exercise Vital Sign    Days of Exercise per Week: 4 days    Minutes of Exercise per Session: 20 min  Stress: No Stress Concern Present (01/16/2023)   [employer] of Occupational Health - Occupational Stress Questionnaire    Feeling of Stress : Not at all  Social Connections: Moderately Integrated (01/16/2023)   Social Connection and Isolation Panel [NHANES]    Frequency of Communication with Friends and Family: More than three times a week    Frequency of Social Gatherings with Friends and Family: More than three times a week    Attends Religious Services: More than 4 times per year    Active Member of [employer] or Organizations: Yes    Attends Engineer, structural: More than 4  times per year    Marital Status: Divorced    Tobacco Counseling Counseling given: Not Answered   Clinical Intake:  Pre-visit preparation completed: Yes  Pain : No/denies pain  Nutritional Risks: None Diabetes: No  How often do you need to have someone help you when you read instructions, pamphlets, or other written materials from your doctor or pharmacy?: 1 - Never  Interpreter Needed?: No  Information entered by :: [employer], CMA   Activities of Daily Living    01/12/2023    8:01 PM  In your present state of health, do you have any difficulty performing the following activities:  Hearing? 1  Comment wears  hearing aids  Vision? 0  Difficulty concentrating or making decisions? 0  Walking or climbing stairs? 1  Dressing or bathing? 0  Doing errands, shopping? 0  Preparing Food and eating ? N  Using the Toilet? N  In the past six months, have you accidently leaked urine? N  Do you have problems with loss of bowel control? N  Managing your Medications? N  Managing your Finances? N  Housekeeping or managing your Housekeeping? N    Patient Care Team: Copland, Gwenlyn Found, MD as PCP - General (Family Medicine) Sheran Luz, MD as Consulting Physician (Physical Medicine and Rehabilitation)  Indicate any recent Medical Services you may have received from other than [facility] providers in the past year (date may be approximate).     Assessment:   This is a routine wellness examination for [place].  Hearing/Vision screen No results found.   Goals Addressed   None    Depression Screen    01/17/2023    8:25 AM 09/06/2022    2:57 PM 01/12/2022    8:35 AM 01/10/2021    8:27 AM 07/28/2020    9:15 AM 10/12/2015   10:04 AM 10/08/2015   10:01 AM  PHQ 2/9 Scores  PHQ - 2 Score 0 0 0 0 0 0 0    Fall Risk    01/12/2023    8:01 PM 09/06/2022    2:56 PM 01/12/2022    8:32 AM 01/10/2021    8:25 AM 07/28/2020    9:13 AM  Fall Risk   Falls in the past year? 0 0 0 1 0  Number  falls in past yr: 0 1 0 0   Injury with Fall? 0 0 0 0   Risk for fall due to : No Fall Risks History of fall(s) No Fall Risks History of fall(s)   Follow up Falls evaluation completed Falls evaluation completed Falls evaluation completed Falls prevention discussed     MEDICARE RISK AT HOME: Medicare Risk at Home Any stairs in or around the home?: No If so, are there any without handrails?: No Home free of loose throw rugs in walkways, pet beds, electrical cords, etc?: No Adequate lighting in your home to reduce risk of falls?: Yes Life alert?: No Use of a cane, walker or w/c?: No Grab bars in the bathroom?: Yes Shower chair or bench in shower?: No Elevated toilet seat or a handicapped toilet?: No  TIMED UP AND GO:  Was the test performed?  No    Cognitive Function:        01/17/2023    8:26 AM 01/12/2022    8:39 AM  6CIT Screen  What Year? 0 points 0 points  What month? 0 points 0 points  What time? 0 points 0 points  Count back from 20 0 points 0 points  Months in reverse 0 points 0 points  Repeat phrase 0 points 4 points  Total Score 0 points 4 points    Immunizations Immunization History  Administered Date(s) Administered   Influenza Split 12/20/2011, 11/13/2012, 11/06/2013   Influenza,inj,Quad PF,6+ Mos 10/30/2016, 10/17/2017   Influenza-Unspecified 10/01/2015, 11/02/2020, 11/24/2021, 11/18/2022   PFIZER(Purple Top)SARS-COV-2 Vaccination 04/05/2019, 04/26/2019   Pfizer Covid-19 Vaccine Bivalent Booster 3yrs & up 11/02/2020   Pfizer(Comirnaty)Fall Seasonal Vaccine 12 years and older 11/24/2021   Pneumococcal Conjugate-13 07/30/2019   Pneumococcal Polysaccharide-23 11/09/2021   Respiratory Syncytial Virus Vaccine,Recomb Aduvanted(Arexvy) 11/24/2021   Rsv, Bivalent, Protein Subunit Rsvpref,pf Verdis Frederickson) 11/18/2022   Td 10/17/2017   Tdap 06/06/2007  Unspecified SARS-COV-2 Vaccination 11/18/2022   Zoster Recombinant(Shingrix) 10/16/2018, 12/21/2018   Zoster,  Live 12/27/2010    TDAP status: Up to date  Flu Vaccine status: Up to date  Pneumococcal vaccine status: Due, Education has been provided regarding the importance of this vaccine. Advised may receive this vaccine at local pharmacy or Health Dept. Aware to provide a copy of the vaccination record if obtained from local pharmacy or Health Dept. Verbalized acceptance and understanding.  Covid-19 vaccine status: Information provided on how to obtain vaccines.   Qualifies for Shingles Vaccine? Yes   Zostavax completed Yes   Shingrix Completed?: Yes  Screening Tests Health Maintenance  Topic Date Due   Pneumonia Vaccine 69+ Years old (2 of 2 - PCV) 11/10/2022   COVID-19 Vaccine (6 - 2023-24 season) 01/13/2023   Medicare Annual Wellness (AWV)  01/13/2023   MAMMOGRAM  02/18/2024   Colonoscopy  02/11/2026   DTaP/Tdap/Td (3 - Td or Tdap) 10/18/2027   INFLUENZA VACCINE  Completed   DEXA SCAN  Completed   Hepatitis C Screening  Completed   Zoster Vaccines- Shingrix  Completed   HPV VACCINES  Aged Out    Health Maintenance  Health Maintenance Due  Topic Date Due   Pneumonia Vaccine 78+ Years old (2 of 2 - PCV) 11/10/2022   COVID-19 Vaccine (6 - 2023-24 season) 01/13/2023   Medicare Annual Wellness (AWV)  01/13/2023    Colorectal cancer screening: Type of screening: Colonoscopy. Completed 02/11/21. Repeat every 5 years  Mammogram status: Completed 02/17/22. Repeat every year  Bone Density status: Completed 12/27/21. Results reflect: Bone density results: OSTEOPENIA. Repeat every 2 years.  Lung Cancer Screening: (Low Dose CT Chest recommended if Age 45-80 years, 20 pack-year currently smoking OR have quit w/in 15years.) does not qualify.   Additional Screening:  Hepatitis C Screening: does qualify; Completed 10/05/14  Vision Screening: Recommended annual ophthalmology exams for early detection of glaucoma and other disorders of the eye. Is the patient up to date with their annual  eye exam?  Yes  Who is the provider or what is the name of the office in which the patient attends annual eye exams? [facility] If pt is not established with a provider, would they like to be referred to a provider to establish care? No .   Dental Screening: Recommended annual dental exams for proper oral hygiene  Diabetic Foot Exam: N/a  Community Resource Referral / Chronic Care Management: CRR required this visit?  No   CCM required this visit?  No     Plan:     I have personally reviewed and noted the following in the patient's chart:   Medical and social history Use of alcohol, tobacco or illicit drugs  Current medications and supplements including opioid prescriptions. Patient is currently taking opioid prescriptions. Information provided to patient regarding non-opioid alternatives. Patient advised to discuss non-opioid treatment plan with their provider. Functional ability and status Nutritional status Physical activity Advanced directives List of other physicians Hospitalizations, surgeries, and ER visits in previous 12 months Vitals Screenings to include cognitive, depression, and falls Referrals and appointments  In addition, I have reviewed and discussed with patient certain preventive protocols, quality metrics, and best practice recommendations. A written personalized care plan for preventive services as well as general preventive health recommendations were provided to patient.     Donne Anon, CMA   01/17/2023   After Visit Summary: (MyChart) Due to this being a telephonic visit, the after visit summary with patients personalized plan  was offered to patient via MyChart   Nurse Notes: None

## 2023-01-17 NOTE — Patient Instructions (Signed)
It was good to see you today-we will go ahead and do crease your Zepbound to 12.5 mg.  We can scale down to 10 mg as desired, perhaps in 3 months.  We can scale back sooner if you are losing too much weight  I think your plan to take 25 mg of metoprolol is fine.  Please let me know if you continue to feel lightheaded or if your blood pressure is running less than about 120/75 on this dosage  I will be in touch with your chest x-ray as soon as possible we will plan to set you up for a treadmill stress test to further evaluate your shortness of breath

## 2023-01-17 NOTE — Telephone Encounter (Signed)
Appt later today.

## 2023-01-17 NOTE — Patient Instructions (Signed)
Ms. Kristen Scott , Thank you for taking time to come for your Medicare Wellness Visit. I appreciate your ongoing commitment to your health goals. Please review the following plan we discussed and let me know if I can assist you in the future.     This is a list of the screening recommended for you and due dates:  Health Maintenance  Topic Date Due   Pneumonia Vaccine (2 of 2 - PCV) 11/10/2022   COVID-19 Vaccine (6 - 2023-24 season) 01/13/2023   Medicare Annual Wellness Visit  01/17/2024   Mammogram  02/18/2024   Colon Cancer Screening  02/11/2026   DTaP/Tdap/Td vaccine (3 - Td or Tdap) 10/18/2027   Flu Shot  Completed   DEXA scan (bone density measurement)  Completed   Hepatitis C Screening  Completed   Zoster (Shingles) Vaccine  Completed   HPV Vaccine  Aged Out    Next appointment: Follow up in one year for your annual wellness visit.   Preventive Care 37 Years and Older, Female Preventive care refers to lifestyle choices and visits with your health care provider that can promote health and wellness. What does preventive care include? A yearly physical exam. This is also called an annual well check. Dental exams once or twice a year. Routine eye exams. Ask your health care provider how often you should have your eyes checked. Personal lifestyle choices, including: Daily care of your teeth and gums. Regular physical activity. Eating a healthy diet. Avoiding tobacco and drug use. Limiting alcohol use. Practicing safe sex. Taking low-dose aspirin every day. Taking vitamin and mineral supplements as recommended by your health care provider. What happens during an annual well check? The services and screenings done by your health care provider during your annual well check will depend on your age, overall health, lifestyle risk factors, and family history of disease. Counseling  Your health care provider may ask you questions about your: Alcohol use. Tobacco use. Drug  use. Emotional well-being. Home and relationship well-being. Sexual activity. Eating habits. History of falls. Memory and ability to understand (cognition). Work and work Astronomer. Reproductive health. Screening  You may have the following tests or measurements: Height, weight, and BMI. Blood pressure. Lipid and cholesterol levels. These may be checked every 5 years, or more frequently if you are over 3 years old. Skin check. Lung cancer screening. You may have this screening every year starting at age 23 if you have a 30-pack-year history of smoking and currently smoke or have quit within the past 15 years. Fecal occult blood test (FOBT) of the stool. You may have this test every year starting at age 91. Flexible sigmoidoscopy or colonoscopy. You may have a sigmoidoscopy every 5 years or a colonoscopy every 10 years starting at age 48. Hepatitis C blood test. Hepatitis B blood test. Sexually transmitted disease (STD) testing. Diabetes screening. This is done by checking your blood sugar (glucose) after you have not eaten for a while (fasting). You may have this done every 1-3 years. Bone density scan. This is done to screen for osteoporosis. You may have this done starting at age 58. Mammogram. This may be done every 1-2 years. Talk to your health care provider about how often you should have regular mammograms. Talk with your health care provider about your test results, treatment options, and if necessary, the need for more tests. Vaccines  Your health care provider may recommend certain vaccines, such as: Influenza vaccine. This is recommended every year. Tetanus, diphtheria, and acellular  pertussis (Tdap, Td) vaccine. You may need a Td booster every 10 years. Zoster vaccine. You may need this after age 14. Pneumococcal 13-valent conjugate (PCV13) vaccine. One dose is recommended after age 41. Pneumococcal polysaccharide (PPSV23) vaccine. One dose is recommended after age  38. Talk to your health care provider about which screenings and vaccines you need and how often you need them. This information is not intended to replace advice given to you by your health care provider. Make sure you discuss any questions you have with your health care provider. Document Released: 02/19/2015 Document Revised: 10/13/2015 Document Reviewed: 11/24/2014 Elsevier Interactive Patient Education  2017 [employer].  Fall Prevention in the Home Falls can cause injuries. They can happen to people of all ages. There are many things you can do to make your home safe and to help prevent falls. What can I do on the outside of my home? Regularly fix the edges of walkways and driveways and fix any cracks. Remove anything that might make you trip as you walk through a door, such as a raised step or threshold. Trim any bushes or trees on the path to your home. Use bright outdoor lighting. Clear any walking paths of anything that might make someone trip, such as rocks or tools. Regularly check to see if handrails are loose or broken. Make sure that both sides of any steps have handrails. Any raised decks and porches should have guardrails on the edges. Have any leaves, snow, or ice cleared regularly. Use sand or salt on walking paths during winter. Clean up any spills in your garage right away. This includes oil or grease spills. What can I do in the bathroom? Use night lights. Install grab bars by the toilet and in the tub and shower. Do not use towel bars as grab bars. Use non-skid mats or decals in the tub or shower. If you need to sit down in the shower, use a plastic, non-slip stool. Keep the floor dry. Clean up any water that spills on the floor as soon as it happens. Remove soap buildup in the tub or shower regularly. Attach bath mats securely with double-sided non-slip rug tape. Do not have throw rugs and other things on the floor that can make you trip. What can I do in the  bedroom? Use night lights. Make sure that you have a light by your bed that is easy to reach. Do not use any sheets or blankets that are too big for your bed. They should not hang down onto the floor. Have a firm chair that has side arms. You can use this for support while you get dressed. Do not have throw rugs and other things on the floor that can make you trip. What can I do in the kitchen? Clean up any spills right away. Avoid walking on wet floors. Keep items that you use a lot in easy-to-reach places. If you need to reach something above you, use a strong step stool that has a grab bar. Keep electrical cords out of the way. Do not use floor polish or wax that makes floors slippery. If you must use wax, use non-skid floor wax. Do not have throw rugs and other things on the floor that can make you trip. What can I do with my stairs? Do not leave any items on the stairs. Make sure that there are handrails on both sides of the stairs and use them. Fix handrails that are broken or loose. Make sure that handrails  are as long as the stairways. Check any carpeting to make sure that it is firmly attached to the stairs. Fix any carpet that is loose or worn. Avoid having throw rugs at the top or bottom of the stairs. If you do have throw rugs, attach them to the floor with carpet tape. Make sure that you have a light switch at the top of the stairs and the bottom of the stairs. If you do not have them, ask someone to add them for you. What else can I do to help prevent falls? Wear shoes that: Do not have high heels. Have rubber bottoms. Are comfortable and fit you well. Are closed at the toe. Do not wear sandals. If you use a stepladder: Make sure that it is fully opened. Do not climb a closed stepladder. Make sure that both sides of the stepladder are locked into place. Ask someone to hold it for you, if possible. Clearly mark and make sure that you can see: Any grab bars or  handrails. First and last steps. Where the edge of each step is. Use tools that help you move around (mobility aids) if they are needed. These include: Canes. Walkers. Scooters. Crutches. Turn on the lights when you go into a dark area. Replace any light bulbs as soon as they burn out. Set up your furniture so you have a clear path. Avoid moving your furniture around. If any of your floors are uneven, fix them. If there are any pets around you, be aware of where they are. Review your medicines with your doctor. Some medicines can make you feel dizzy. This can increase your chance of falling. Ask your doctor what other things that you can do to help prevent falls. This information is not intended to replace advice given to you by your health care provider. Make sure you discuss any questions you have with your health care provider. Document Released: 11/19/2008 Document Revised: 07/01/2015 Document Reviewed: 02/27/2014 Elsevier Interactive Patient Education  2017 [employer].

## 2023-02-11 ENCOUNTER — Other Ambulatory Visit: Payer: Self-pay | Admitting: Family Medicine

## 2023-02-14 DIAGNOSIS — F411 Generalized anxiety disorder: Secondary | ICD-10-CM | POA: Diagnosis not present

## 2023-02-19 DIAGNOSIS — M5030 Other cervical disc degeneration, unspecified cervical region: Secondary | ICD-10-CM | POA: Diagnosis not present

## 2023-02-19 DIAGNOSIS — M51362 Other intervertebral disc degeneration, lumbar region with discogenic back pain and lower extremity pain: Secondary | ICD-10-CM | POA: Diagnosis not present

## 2023-02-19 DIAGNOSIS — G894 Chronic pain syndrome: Secondary | ICD-10-CM | POA: Diagnosis not present

## 2023-02-23 ENCOUNTER — Ambulatory Visit
Admission: RE | Admit: 2023-02-23 | Discharge: 2023-02-23 | Disposition: A | Discharge status: Home / Self Care | Payer: Medicare PPO | Source: Ambulatory Visit | Attending: Family Medicine | Admitting: Family Medicine

## 2023-02-23 DIAGNOSIS — Z1231 Encounter for screening mammogram for malignant neoplasm of breast: Secondary | ICD-10-CM | POA: Diagnosis not present

## 2023-03-01 ENCOUNTER — Ambulatory Visit: Payer: Medicare PPO | Attending: Family Medicine

## 2023-03-01 DIAGNOSIS — R0602 Shortness of breath: Secondary | ICD-10-CM | POA: Diagnosis not present

## 2023-03-01 LAB — EXERCISE TOLERANCE TEST
Angina Index: 0
Base ST Depression (mm): 0 mm
Duke Treadmill Score: 3
Estimated workload: 4.7
Exercise duration (min): 3 min
Exercise duration (sec): 7 s
MPHR: 151 {beats}/min
Peak HR: 109 {beats}/min
Percent HR: 72 %
RPE: 12
Rest HR: 72 {beats}/min
ST Depression (mm): 0 mm

## 2023-03-02 ENCOUNTER — Encounter: Payer: Self-pay | Admitting: Family Medicine

## 2023-03-02 ENCOUNTER — Other Ambulatory Visit: Payer: Self-pay | Admitting: Family Medicine

## 2023-03-02 DIAGNOSIS — R7303 Prediabetes: Secondary | ICD-10-CM

## 2023-03-08 ENCOUNTER — Telehealth: Payer: Self-pay

## 2023-03-08 NOTE — Telephone Encounter (Signed)
Noted, no problem.

## 2023-03-08 NOTE — Telephone Encounter (Signed)
Received PA for Zepbound- I see that Pt has [employer] Medicare- is this one of your patients paying OOP for it?

## 2023-03-08 NOTE — Telephone Encounter (Signed)
Sorry, this is the one that I initiated- I didn't realize we were both working on it.

## 2023-03-08 NOTE — Telephone Encounter (Signed)
Tried initiating PA via Covermymeds- tell me that there is already a PA started- will have to call [employer] tomorrow to have them cancel that one out.

## 2023-03-09 ENCOUNTER — Encounter: Payer: Self-pay | Admitting: Family Medicine

## 2023-03-09 NOTE — Telephone Encounter (Signed)
PA denied.   You asked for the drug above for your  Hyperlipidemia, unspecified. This is an off-label use  that is not medically accepted. The Medicare rule in  the Prescription Drug Benefit Manual (Chapter 6,  Section 10.6) says a drug must be used for a  medically accepted indication (covered use). Offlabel use is medically accepted when there is proof  in one or more of the drug guides that the drug  works for your condition. We look at the two major  drug guides (compendia): the DRUGDEX  Information System and the [facility]  Formulary Service Drug Information (AHFS-DI).  [employer] has decided that there is no proof in either  drug guide that this drug works for your condition. In  addition, the Medicare rule in the Prescription Drug  Manual (Chapter 6, Section 20.1) says drugs used  to help you lose weight are excluded from Medicare  Part D coverage. Per Medicare rules, it is not  covered.

## 2023-03-22 NOTE — Telephone Encounter (Signed)
Fax came through stating that they still agree with initial determination. And are denying Zepbound. Medicare says that drugs used for weight loss are excluded from Part D coverage.   Form is in folder for review and will be faxed into chart.

## 2023-03-26 ENCOUNTER — Encounter: Payer: Self-pay | Admitting: Family Medicine

## 2023-04-21 ENCOUNTER — Other Ambulatory Visit: Payer: Self-pay | Admitting: Family Medicine

## 2023-04-21 DIAGNOSIS — E785 Hyperlipidemia, unspecified: Secondary | ICD-10-CM

## 2023-05-15 ENCOUNTER — Other Ambulatory Visit: Payer: Self-pay | Admitting: Family Medicine

## 2023-05-15 DIAGNOSIS — R110 Nausea: Secondary | ICD-10-CM

## 2023-05-22 DIAGNOSIS — H6123 Impacted cerumen, bilateral: Secondary | ICD-10-CM | POA: Diagnosis not present

## 2023-06-14 NOTE — Progress Notes (Signed)
 [facility] [address] [place], [place] [address] [phone]  Date:  06/18/2023   Name:  Kristen Scott   DOB:  [birth year]   MRN:  [id number]  PCP:  Kaylee Partridge, MD    Chief Complaint: No chief complaint on file.   History of Present Illness:  Kristen Scott is a 68 y.o. very pleasant female patient who presents with the following:  Patient seen today for a virtual visit to discuss weight loss Patient location is her job, my location is office.  Patient identity confirmed with 2 factors, she gives consent for virtual visit today.  The patient and myself are present on the call  Most recent visit with myself was in December- history of hypertension, prediabetes, hyperlipidemia, chronic arthritis pain, obesity  She was using Zepbound  previously for weight loss She was using the compounded semaglutide - this did not work for her however. She is not quite sure of her dosage right now; she is taking some amount of semaglutide   She learned about the [employer] direct program for Zepbound  and wonders if we could try this for her instead  Her most recent weight is up to about 148- she would like to stay under about 140 She was up to 220 max   Wt Readings from Last 3 Encounters:  01/17/23 144 lb 6.4 oz (65.5 kg)  09/06/22 173 lb 6.4 oz (78.7 kg)  01/12/22 193 lb 6.4 oz (87.7 kg)     Patient Active Problem List   Diagnosis Date Noted   Osteopenia 12/08/2019   Carpal tunnel syndrome 09/30/2015   Pre-diabetes 10/05/2014   GERD (gastroesophageal reflux disease) 03/05/2014   Vitamin D  deficiency 08/20/2011   Hyperlipidemia 08/20/2011   HTN (hypertension) 08/20/2011   Obesity, Class II, BMI 35-39.9 08/20/2011   Chronic pain disorder 08/20/2011   Depression 08/20/2011   DJD (degenerative joint disease) of knee 08/20/2011   Arthritis, low back 03/30/2011   Arthritis of neck (HCC) 03/30/2011    Past Medical  History:  Diagnosis Date   Allergy 2007   A rash from anti inflammatory medicine   Arthritis    Depression    DJD (degenerative joint disease)    left knee   GERD (gastroesophageal reflux disease)    Hearing loss, sensorineural    HTN (hypertension)    Hyperlipidemia    Migraine    Osteopenia     Past Surgical History:  Procedure Laterality Date   BREAST BIOPSY Right 05/25/2016   JOINT REPLACEMENT  2008   left knee    Social History   Tobacco Use   Smoking status: Never   Smokeless tobacco: Never  Substance Use Topics   Alcohol use: Never   Drug use: Never    Family History  Problem Relation Age of Onset   Dementia Mother    Early death Mother    Hypertension Mother    Kidney disease Mother    Diabetes Father    Arthritis Father    Early death Father    Vision loss Father    Diabetes Brother    Stroke Brother    Mental illness Daughter        anxiety, OCD   Anxiety disorder Daughter    Diabetes Sister    Arthritis Sister    Obesity Sister    Diabetes Brother    Arthritis Brother    Heart attack Maternal Grandfather    Hearing  loss Paternal Aunt    Obesity Paternal Aunt     Allergies  Allergen Reactions   Relafen [Nabumetone] Rash    Medication list has been reviewed and updated.  Current Outpatient Medications on File Prior to Visit  Medication Sig Dispense Refill   atorvastatin  (LIPITOR) 40 MG tablet Take 1 tablet (40 mg total) by mouth daily. 90 tablet 0   cholecalciferol (VITAMIN D3) 25 MCG (1000 UNIT) tablet Take 1,000 Units by mouth daily.     DULoxetine  (CYMBALTA ) 30 MG capsule Take 90 mg by mouth daily.     HYDROcodone-acetaminophen  (NORCO) 10-325 MG per tablet Take 1 tablet by mouth 2 (two) times daily.     metFORMIN  (GLUCOPHAGE ) 500 MG tablet TAKE 1 TABLET(500 MG) BY MOUTH DAILY 90 tablet 1   metoprolol  succinate (TOPROL -XL) 25 MG 24 hr tablet Take 3 tablets (75 mg total) by mouth daily. 270 tablet 3   omeprazole (PRILOSEC) 20 MG  capsule Take 20 mg by mouth daily.     tirzepatide  (ZEPBOUND ) 12.5 MG/0.5ML Pen Inject 12.5 mg into the skin once a week. 6 mL 1   No current facility-administered medications on file prior to visit.    Review of Systems:  As per HPI- otherwise negative.   Physical Examination: There were no vitals filed for this visit. There were no vitals filed for this visit. There is no height or weight on file to calculate BMI. Ideal Body Weight:    Patient is her via MyChart video, she looks well-no shortness of breath or distress is noted  Assessment and Plan: Obesity (BMI 35.0-39.9 without comorbidity) - Plan: tirzepatide  5 MG/0.5ML injection vial  Patient with concern of obesity, she has lost quite a bit of weight with a GLP-1 and wants to avoid regaining.  However she is having difficulty obtaining medication due to cost.  She heard about the [employer] direct pharmacy program and would like to try using this to obtain tirzepatide .  We decided to start her on 5 mg as a maintenance dose; she really does not need to lose much weight at this point and she is already taking semaglutide   She will let me know if any concerns  Signed Gates Kasal, MD

## 2023-06-18 ENCOUNTER — Telehealth (INDEPENDENT_AMBULATORY_CARE_PROVIDER_SITE_OTHER): Admitting: Family Medicine

## 2023-06-18 DIAGNOSIS — E669 Obesity, unspecified: Secondary | ICD-10-CM

## 2023-06-18 MED ORDER — TIRZEPATIDE-WEIGHT MANAGEMENT 5 MG/0.5ML ~~LOC~~ SOLN: 5.0000 mg | SUBCUTANEOUS | 1 refills | Status: DC

## 2023-06-26 ENCOUNTER — Other Ambulatory Visit: Payer: Self-pay | Admitting: Family Medicine

## 2023-06-26 DIAGNOSIS — Z79891 Long term (current) use of opiate analgesic: Secondary | ICD-10-CM | POA: Diagnosis not present

## 2023-06-26 DIAGNOSIS — M5030 Other cervical disc degeneration, unspecified cervical region: Secondary | ICD-10-CM | POA: Diagnosis not present

## 2023-06-26 DIAGNOSIS — R7303 Prediabetes: Secondary | ICD-10-CM

## 2023-06-26 DIAGNOSIS — G894 Chronic pain syndrome: Secondary | ICD-10-CM | POA: Diagnosis not present

## 2023-06-26 DIAGNOSIS — M51362 Other intervertebral disc degeneration, lumbar region with discogenic back pain and lower extremity pain: Secondary | ICD-10-CM | POA: Diagnosis not present

## 2023-07-09 ENCOUNTER — Telehealth

## 2023-07-09 DIAGNOSIS — B001 Herpesviral vesicular dermatitis: Secondary | ICD-10-CM | POA: Diagnosis not present

## 2023-07-09 MED ORDER — ACYCLOVIR 5 % EX OINT: 1.0000 | TOPICAL_OINTMENT | CUTANEOUS | 0 refills | Status: DC | PRN

## 2023-07-09 MED ORDER — VALACYCLOVIR HCL 1 G PO TABS: 2000.0000 mg | ORAL_TABLET | Freq: Two times a day (BID) | ORAL | 0 refills | Status: AC

## 2023-07-09 NOTE — Progress Notes (Signed)
 Virtual Visit Consent   Kristen Scott, you are scheduled for a virtual visit with a [facility] provider today. Just as with appointments in the office, your consent must be obtained to participate. Your consent will be active for this visit and any virtual visit you may have with one of our providers in the next 365 days. If you have a MyChart account, a copy of this consent can be sent to you electronically.  As this is a virtual visit, video technology does not allow for your provider to perform a traditional examination. This may limit your provider's ability to fully assess your condition. If your provider identifies any concerns that need to be evaluated in person or the need to arrange testing (such as labs, EKG, etc.), we will make arrangements to do so. Although advances in technology are sophisticated, we cannot ensure that it will always work on either your end or our end. If the connection with a video visit is poor, the visit may have to be switched to a telephone visit. With either a video or telephone visit, we are not always able to ensure that we have a secure connection.  By engaging in this virtual visit, you consent to the provision of healthcare and authorize for your insurance to be billed (if applicable) for the services provided during this visit. Depending on your insurance coverage, you may receive a charge related to this service.  I need to obtain your verbal consent now. Are you willing to proceed with your visit today? Kristen Scott has provided verbal consent on 07/09/2023 for a virtual visit (video or telephone). Angelia Kelp, PA-C  Date: 07/09/2023 2:33 PM   Virtual Visit via Video Note   I, Angelia Kelp, connected with  Kristen Scott  ([id number], 1955/09/25) on 07/09/23 at  2:30 PM EDT by a video-enabled telemedicine application and verified that I am speaking with the correct person using two identifiers.  Location: Patient: Virtual Visit Location  Patient: Home Provider: Virtual Visit Location Provider: Home Office   I discussed the limitations of evaluation and management by telemedicine and the availability of in person appointments. The patient expressed understanding and agreed to proceed.    History of Present Illness: Kristen Scott is a 68 y.o. who identifies as a female who was assigned female at birth, and is being seen today for cold sore. Has history of them and usually uses Lysine, but this one is not improving like usual. This one has been present for a week. Had started to improve, but returned a couple of days ago.   Problems:  Patient Active Problem List   Diagnosis Date Noted   Osteopenia 12/08/2019   Carpal tunnel syndrome 09/30/2015   Pre-diabetes 10/05/2014   GERD (gastroesophageal reflux disease) 03/05/2014   Vitamin D  deficiency 08/20/2011   Hyperlipidemia 08/20/2011   HTN (hypertension) 08/20/2011   Obesity, Class II, BMI 35-39.9 08/20/2011   Chronic pain disorder 08/20/2011   Depression 08/20/2011   DJD (degenerative joint disease) of knee 08/20/2011   Arthritis, low back 03/30/2011   Arthritis of neck (HCC) 03/30/2011    Allergies:  Allergies  Allergen Reactions   Relafen [Nabumetone] Rash   Medications:  Current Outpatient Medications:    acyclovir ointment (ZOVIRAX) 5 %, Apply 1 Application topically every 4 (four) hours as needed., Disp: 30 g, Rfl: 0   atorvastatin  (LIPITOR) 40 MG tablet, Take 1 tablet (40 mg total) by mouth daily., Disp: 90 tablet, Rfl: 0  cholecalciferol (VITAMIN D3) 25 MCG (1000 UNIT) tablet, Take 1,000 Units by mouth daily., Disp: , Rfl:    DULoxetine  (CYMBALTA ) 30 MG capsule, Take 90 mg by mouth daily., Disp: , Rfl:    HYDROcodone-acetaminophen  (NORCO) 10-325 MG per tablet, Take 1 tablet by mouth 2 (two) times daily., Disp: , Rfl:    metFORMIN  (GLUCOPHAGE ) 500 MG tablet, Take 1 tablet (500 mg total) by mouth daily with breakfast., Disp: 30 tablet, Rfl: 0   metoprolol   succinate (TOPROL -XL) 25 MG 24 hr tablet, Take 3 tablets (75 mg total) by mouth daily., Disp: 270 tablet, Rfl: 3   omeprazole (PRILOSEC) 20 MG capsule, Take 20 mg by mouth daily., Disp: , Rfl:    tirzepatide  5 MG/0.5ML injection vial, Inject 5 mg into the skin once a week., Disp: 6 mL, Rfl: 1   valACYclovir (VALTREX) 1000 MG tablet, Take 2 tablets (2,000 mg total) by mouth 2 (two) times daily for 1 day., Disp: 4 tablet, Rfl: 0  Observations/Objective: Patient is well-developed, well-nourished in no acute distress.  Resting comfortably at home.  Head is normocephalic, atraumatic.  No labored breathing.  Speech is clear and coherent with logical content.  Patient is alert and oriented at baseline.    Assessment and Plan: 1. Cold sore (Primary) - valACYclovir (VALTREX) 1000 MG tablet; Take 2 tablets (2,000 mg total) by mouth 2 (two) times daily for 1 day.  Dispense: 4 tablet; Refill: 0 - acyclovir ointment (ZOVIRAX) 5 %; Apply 1 Application topically every 4 (four) hours as needed.  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 The patient was provided an opportunity to ask questions and all were answered. The patient agreed with the plan and demonstrated an understanding of the instructions.  A copy of instructions were sent to the patient via MyChart unless otherwise noted below.    The patient was advised to call back or seek an in-person evaluation if the symptoms worsen or if the condition fails to improve as anticipated.    Angelia Kelp, PA-C

## 2023-07-09 NOTE — Patient Instructions (Signed)
 Kristen Scott, thank you for joining Angelia Kelp, PA-C for today's virtual visit.  While this provider is not your primary care provider (PCP), if your PCP is located in our provider database this encounter information will be shared with them immediately following your visit.   A [facility] MyChart account gives you access to today's visit and all your visits, tests, and labs performed at [facility] " click here if you don't have a [facility] MyChart account or go to [email or web address]  Consent: (Patient) Kristen Scott provided verbal consent for this virtual visit at the beginning of the encounter.  Current Medications:  Current Outpatient Medications:    acyclovir ointment (ZOVIRAX) 5 %, Apply 1 Application topically every 4 (four) hours as needed., Disp: 30 g, Rfl: 0   valACYclovir (VALTREX) 1000 MG tablet, Take 2 tablets (2,000 mg total) by mouth 2 (two) times daily for 1 day., Disp: 4 tablet, Rfl: 0   atorvastatin  (LIPITOR) 40 MG tablet, Take 1 tablet (40 mg total) by mouth daily., Disp: 90 tablet, Rfl: 0   cholecalciferol (VITAMIN D3) 25 MCG (1000 UNIT) tablet, Take 1,000 Units by mouth daily., Disp: , Rfl:    DULoxetine  (CYMBALTA ) 30 MG capsule, Take 90 mg by mouth daily., Disp: , Rfl:    HYDROcodone-acetaminophen  (NORCO) 10-325 MG per tablet, Take 1 tablet by mouth 2 (two) times daily., Disp: , Rfl:    metFORMIN  (GLUCOPHAGE ) 500 MG tablet, Take 1 tablet (500 mg total) by mouth daily with breakfast., Disp: 30 tablet, Rfl: 0   metoprolol  succinate (TOPROL -XL) 25 MG 24 hr tablet, Take 3 tablets (75 mg total) by mouth daily., Disp: 270 tablet, Rfl: 3   omeprazole (PRILOSEC) 20 MG capsule, Take 20 mg by mouth daily., Disp: , Rfl:    tirzepatide  5 MG/0.5ML injection vial, Inject 5 mg into the skin once a week., Disp: 6 mL, Rfl: 1   Medications ordered in this encounter:  Meds ordered this encounter  Medications   valACYclovir (VALTREX) 1000 MG tablet     Sig: Take 2 tablets (2,000 mg total) by mouth 2 (two) times daily for 1 day.    Dispense:  4 tablet    Refill:  0    Supervising Provider:   LAMPTEY, PHILIP O [[id number]]   acyclovir ointment (ZOVIRAX) 5 %    Sig: Apply 1 Application topically every 4 (four) hours as needed.    Dispense:  30 g    Refill:  0    Supervising Provider:   Corine Dice [[id number]]     *If you need refills on other medications prior to your next appointment, please contact your pharmacy*  Follow-Up: Call back or seek an in-person evaluation if the symptoms worsen or if the condition fails to improve as anticipated.  [facility] [phone]  Other Instructions Cold Sore  A cold sore, also called a fever blister, is a small, fluid-filled sore that forms inside the mouth or on the lips, gums, nose, chin, or cheeks. Cold sores can spread to other parts of the body, such as the eyes, fingers, or genitals. Cold sores can spread from person to person (are contagious) until the sores crust over completely. Most cold sores go away within 2 weeks. What are the causes? Cold sores are caused by an infection from a common type of herpes simplex virus (HSV-1). HSV-1 is closely related to the HSV-2virus, which is the virus that causes genital herpes, but these viruses are not  the same. Once a person is infected with HSV-1, the virus remains permanently in the body. HSV-1 is spread from person to person through close contact, such as through kissing, touching the affected area, or sharing personal items such as lip balm, razors, a drinking glass, or eating utensils. What increases the risk? You are more likely to develop this condition if you: Are tired, stressed, or sick. Are menstruating. Are pregnant. Take certain medicines. Are exposed to cold weather or too much sun. What are the signs or symptoms? Symptoms of a cold sore outbreak go through different stages. These are the stages of a cold  sore: Tingling, itching, or burning is felt 1-2 days before the outbreak. Fluid-filled blisters appear on the lips, inside the mouth, on the nose, or on the cheeks. The blisters start to ooze clear fluid. The blisters dry up, and a yellow crust appears in their place. The crust falls off. In some cases, other symptoms can develop during a cold sore outbreak. These can include: Fever. Sore throat. Headache. Muscle aches. Swollen neck glands. How is this diagnosed? This condition is diagnosed based on your medical history and a physical exam. Your health care provider may do a blood test or may swab some fluid from your sore and then examine the swab in the lab. How is this treated? There is no cure for cold sores or HSV-1. There is also no vaccine for HSV-1. Most cold sores go away on their own without treatment within 2 weeks. Medicines cannot make the infection go away, but your health care provider may prescribe medicines to: Help relieve some of the pain associated with the sores. Work to stop the virus from multiplying. Shorten healing time. Medicines may be in the form of creams, gels, pills, or a shot. Follow these instructions at home: Medicines Take or apply over-the-counter and prescription medicines only as told by your health care provider. Use a cotton-tip swab to apply creams or gels to your sores. Ask your health care provider if you can take lysine supplements. Research has found that lysine may help heal the cold sore faster and prevent outbreaks. Sore care  Do not touch the sores or pick the scabs. Wash your hands often with soap and water for at least 20 seconds. Do not touch your eyes without washing your hands first. Keep the sores clean and dry. If directed, put ice on the sores. To do this: Put ice in a plastic bag. Place a towel between your skin and the bag. Leave the ice on for 20 minutes, 2-3 times a day. Remove the ice if your skin turns bright red. This  is very important. If you cannot feel pain, heat, or cold, you have a greater risk of damage to the area. Eating and drinking Eat a soft, bland diet. Avoid eating hot, cold, or salty foods. Use a straw if it hurts to drink out of a glass. Eat foods that are rich in lysine, such as meat, fish, and dairy products. Avoid sugary foods, chocolates, nuts, and grains. These foods are rich in a nutrient called arginine, which can cause the virus to multiply. Lifestyle Do not kiss, have oral sex, or share personal items until your sores heal. Stress, poor sleep, and being out in the sun can trigger outbreaks. Make sure you: Do activities that help you relax, such as deep breathing exercises or meditation. Get enough sleep. Apply sunscreen on your lips before you go out in the sun. Contact a health  care provider if: You have symptoms for more than 2 weeks. You have pus coming from the sores. You have redness that is spreading. You have pain or irritation in your eye. You get sores on your genitals. Your sores do not heal within 2 weeks. You have frequent cold sore outbreaks. Get help right away if: You have a fever and your symptoms suddenly get worse. You have a headache and confusion. You have tiredness (fatigue) or loss of appetite. You have a stiff neck or sensitivity to light. Summary A cold sore, also called a fever blister, is a small, fluid-filled sore that forms inside the mouth or on the lips, gums, nose, chin, or cheeks. Most cold sores go away on their own without treatment within 2 weeks. Your health care provider may prescribe medicines to help relieve some of the pain, work to stop the virus from multiplying, and shorten healing time. Wash your hands often with soap and water for at least 20 seconds. Do not touch your eyes without washing your hands first. Do not kiss, have oral sex, or share personal items until your sores heal. Contact a health care provider if your sores do not  heal within 2 weeks. This information is not intended to replace advice given to you by your health care provider. Make sure you discuss any questions you have with your health care provider. Document Revised: 11/03/2020 Document Reviewed: 11/03/2020 Elsevier Patient Education  2024 [employer]   If you have been instructed to have an in-person evaluation today at a local Urgent Care facility, please use the link below. It will take you to a list of all of our available [facility], including address, phone number and hours of operation. Please do not delay care.  [facility]  If you or a family member do not have a primary care provider, use the link below to schedule a visit and establish care. When you choose a [facility] primary care physician or advanced practice provider, you gain a long-term partner in health. Find a Primary Care Provider  Learn more about [facility]'s in-office and virtual care options: [facility] - Get Care Now

## 2023-07-14 ENCOUNTER — Ambulatory Visit
Admission: EM | Admit: 2023-07-14 | Discharge: 2023-07-14 | Disposition: A | Discharge status: Home / Self Care | Attending: Emergency Medicine | Admitting: Emergency Medicine

## 2023-07-14 DIAGNOSIS — J020 Streptococcal pharyngitis: Secondary | ICD-10-CM | POA: Diagnosis not present

## 2023-07-14 DIAGNOSIS — B379 Candidiasis, unspecified: Secondary | ICD-10-CM

## 2023-07-14 DIAGNOSIS — T3695XA Adverse effect of unspecified systemic antibiotic, initial encounter: Secondary | ICD-10-CM | POA: Diagnosis not present

## 2023-07-14 LAB — POCT RAPID STREP A (OFFICE): Rapid Strep A Screen: POSITIVE — AB

## 2023-07-14 MED ORDER — FLUCONAZOLE 150 MG PO TABS: ORAL_TABLET | ORAL | 0 refills | Status: DC

## 2023-07-14 MED ORDER — AMOXICILLIN 500 MG PO CAPS: 1000.0000 mg | ORAL_CAPSULE | Freq: Every day | ORAL | 0 refills | Status: AC

## 2023-07-14 NOTE — Discharge Instructions (Signed)
 Your strep test today is positive.  I recommend that you begin antibiotics now for treatment.  I have sent a prescription to your pharmacy.  Please take them as prescribed.  You will begin to feel better in about 24 hours.  Please be sure that you you finish the entire 10-day course of treatment to avoid worsening infection that may require longer treatment with stronger antibiotics.   After 24 hours, please discard your toothbrush as well as any other oral devices that you are currently using and replace them with new ones to avoid reinfection.  Also after 24 hours, you will no longer be contagious.    Please read below to learn more about the medications, dosages and frequencies that I recommend to help alleviate your symptoms and to get you feeling better soon:   Amoxicillin :  Please take two (2) capsules daily for 10 days.  This antibiotic can cause upset stomach, this will resolve once antibiotics are complete.  You are welcome to take a probiotic, eat yogurt, take Imodium while taking this medication.  Please avoid other systemic medications such as Maalox, Pepto-Bismol or milk of magnesia as they can interfere with the body's ability to absorb the antibiotics.  Diflucan (fluconazole): Taking antibiotics can often cause patients to develop a vaginal yeast infection.  For this reason, I have provided you with a prescription for Diflucan, and antifungal medication used to treat vaginal yeast infections.  Please take the first Diflucan tablet on day 3 or 4 of your antibiotic therapy, and take the second Diflucan tablet 3 days later.  You do not need to pick up this prescription or take this medication unless you develop symptoms of vaginal yeast infection including thick, white vaginal discharge and/or vaginal itching.  This prescription has been provided as a [employer], political party and for your convenience.   Advil, Motrin (ibuprofen): This is a good anti-inflammatory medication which addresses aches, pains and  inflammation of the upper airways that causes sinus and nasal congestion as well as in the lower airways which makes your cough feel tight and sometimes burn.  I recommend that you take between 400 to 600 mg every 6-8 hours as needed.  Please do not take more than 2400 mg of ibuprofen in a 24-hour period and please do not take high doses of ibuprofen for more than 3 days in a row as this can lead to stomach ulcers.   Chloraseptic Throat Spray: Spray 5 sprays into affected area every 2 hours, hold for 15 seconds and either swallow or spit it out.  This is a excellent numbing medication.  Because it is a spray, you can apply it where your throat hurts and avoid numbing your entire mouth as a sore throat lozenge will.     If symptoms have not meaningfully improved in the next 3 to 5 days, please return for repeat evaluation or follow-up with your regular provider.  If symptoms have worsened in the next 3 to 5 days, please go to the emergency room for further evaluation.    Thank you for visiting urgent care today.  We appreciate the opportunity to participate in your care.

## 2023-07-14 NOTE — ED Provider Notes (Signed)
 [facility]    CSN: [id number] Arrival date & time: 07/14/23  6578    HISTORY  No chief complaint on file.  HPI Kristen Scott is a pleasant, 68 y.o. female who presents to urgent care today. Patient complains of a 2-day history of bodyaches, headache, sore throat.  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  Patient states she has been taking Tylenol  and Goody powders without relief of her symptoms.  Patient states she also noticed a fever blister on her right upper lip a few days ago.  Patient has essentially normal vital signs on arrival today, denies known sick contacts, nausea, vomiting, cough, rhinorrhea, nasal congestion, fever.  The history is provided by the patient.   Past Medical History:  Diagnosis Date   Allergy 2007   A rash from anti inflammatory medicine   Arthritis    Depression    DJD (degenerative joint disease)    left knee   GERD (gastroesophageal reflux disease)    Hearing loss, sensorineural    HTN (hypertension)    Hyperlipidemia    Migraine    Osteopenia    Patient Active Problem List   Diagnosis Date Noted   Osteopenia 12/08/2019   Carpal tunnel syndrome 09/30/2015   Pre-diabetes 10/05/2014   GERD (gastroesophageal reflux disease) 03/05/2014   Vitamin D  deficiency 08/20/2011   Hyperlipidemia 08/20/2011   HTN (hypertension) 08/20/2011   Obesity, Class II, BMI 35-39.9 08/20/2011   Chronic pain disorder 08/20/2011   Depression 08/20/2011   DJD (degenerative joint disease) of knee 08/20/2011   Arthritis, low back 03/30/2011   Arthritis of neck (HCC) 03/30/2011   Past Surgical History:  Procedure Laterality Date   BREAST BIOPSY Right 05/25/2016   JOINT REPLACEMENT  2008   left knee   OB History   No obstetric history on file.    Home Medications    Prior to Admission medications   Medication Sig Start Date End Date Taking? Authorizing Provider   amoxicillin  (AMOXIL ) 500 MG capsule Take 2 capsules (1,000 mg total) by mouth daily for 10 days. 07/14/23 07/24/23 Yes [name], PA-C  fluconazole (DIFLUCAN) 150 MG tablet Take 1 tablet on day 4 of antibiotics.  Take second tablet 3 days later. 07/14/23  Yes [name], PA-C  acyclovir  ointment (ZOVIRAX ) 5 % Apply 1 Application topically every 4 (four) hours as needed. 07/09/23   Angelia Kelp, PA-C  atorvastatin  (LIPITOR) 40 MG tablet Take 1 tablet (40 mg total) by mouth daily. 04/23/23   Copland, Jessica C, MD  cholecalciferol (VITAMIN D3) 25 MCG (1000 UNIT) tablet Take 1,000 Units by mouth daily.    [provider]  DULoxetine  (CYMBALTA ) 30 MG capsule Take 90 mg by mouth daily. 08/17/11   Ruthann Cover, MD  HYDROcodone-acetaminophen  [facility]) 10-325 MG per tablet Take 1 tablet by mouth 2 (two) times daily.    [provider]  metFORMIN  (GLUCOPHAGE ) 500 MG tablet Take 1 tablet (500 mg total) by mouth daily with breakfast. 06/26/23   Copland, Skipper Dumas, MD  metoprolol  succinate (TOPROL -XL) 25 MG 24 hr tablet Take 3 tablets (75 mg total) by mouth daily. 09/06/22   Copland, Jessica C, MD  omeprazole (PRILOSEC) 20 MG capsule Take 20 mg by mouth daily.    [provider]  tirzepatide  5 MG/0.5ML injection vial Inject 5 mg into the skin once a week. 06/18/23  Copland, Skipper Dumas, MD    Family History Family History  Problem Relation Age of Onset   Dementia Mother    Early death Mother    Hypertension Mother    Kidney disease Mother    Diabetes Father    Arthritis Father    Early death Father    Vision loss Father    Diabetes Brother    Stroke Brother    Mental illness Daughter        anxiety, OCD   Anxiety disorder Daughter    Diabetes Sister    Arthritis Sister    Obesity Sister    Diabetes Brother    Arthritis Brother    Heart attack Maternal Grandfather    Hearing loss Paternal Aunt    Obesity Paternal Aunt    Social  History Social History   Tobacco Use   Smoking status: Never   Smokeless tobacco: Never  Substance Use Topics   Alcohol use: Never   Drug use: Never   Allergies   Relafen [nabumetone]  Review of Systems Review of Systems Pertinent findings revealed after performing a 14 point review of systems has been noted in the history of present illness.  Physical Exam Vital Signs BP 101/66 (BP Location: Right Arm)   Pulse 81   Temp (!) 97.5 F (36.4 C) (Oral)   Resp 16   LMP 03/29/2006   SpO2 95%   No data found.  Physical Exam Vitals and nursing note reviewed.  Constitutional:      General: She is not in acute distress.    Appearance: Normal appearance. She is not ill-appearing.  HENT:     Head: Normocephalic and atraumatic.     Salivary Glands: Right salivary gland is not diffusely enlarged or tender. Left salivary gland is not diffusely enlarged or tender.     Right Ear: Tympanic membrane, ear canal and external ear normal. No drainage. No middle ear effusion. There is no impacted cerumen. Tympanic membrane is not erythematous or bulging.     Left Ear: Tympanic membrane, ear canal and external ear normal. No drainage.  No middle ear effusion. There is no impacted cerumen. Tympanic membrane is not erythematous or bulging.     Nose: Nose normal. No nasal deformity, septal deviation, mucosal edema, congestion or rhinorrhea.     Right Turbinates: Not enlarged, swollen or pale.     Left Turbinates: Not enlarged, swollen or pale.     Right Sinus: No maxillary sinus tenderness or frontal sinus tenderness.     Left Sinus: No maxillary sinus tenderness or frontal sinus tenderness.     Mouth/Throat:     Lips: Pink. No lesions.     Mouth: Mucous membranes are moist. No oral lesions.     Pharynx: Uvula midline. Pharyngeal swelling and posterior oropharyngeal erythema present. No oropharyngeal exudate, uvula swelling or postnasal drip.     Tonsils: No tonsillar exudate. 1+ on the right. 1+  on the left.   Eyes:     General: Lids are normal.        Right eye: No discharge.        Left eye: No discharge.     Extraocular Movements: Extraocular movements intact.     Conjunctiva/sclera: Conjunctivae normal.     Right eye: Right conjunctiva is not injected.     Left eye: Left conjunctiva is not injected.  Neck:     Trachea: Trachea and phonation normal.  Cardiovascular:     Rate and Rhythm: Normal  rate and regular rhythm.     Pulses: Normal pulses.     Heart sounds: Normal heart sounds, S1 normal and S2 normal. No murmur heard.    No friction rub. No gallop.  Pulmonary:     Effort: Pulmonary effort is normal. No accessory muscle usage, prolonged expiration or respiratory distress.     Breath sounds: Normal breath sounds and air entry. No stridor, decreased air movement or transmitted upper airway sounds. No decreased breath sounds, wheezing, rhonchi or rales.  Chest:     Chest wall: No tenderness.  Musculoskeletal:        General: Normal range of motion.     Cervical back: Normal range of motion and neck supple. Normal range of motion.  Lymphadenopathy:     Cervical: No cervical adenopathy.  Skin:    General: Skin is warm and dry.     Findings: No erythema or rash.  Neurological:     General: No focal deficit present.     Mental Status: She is alert and oriented to person, place, and time.  Psychiatric:        Mood and Affect: Mood normal.        Behavior: Behavior normal.     Visual Acuity Right Eye Distance:   Left Eye Distance:   Bilateral Distance:    Right Eye Near:   Left Eye Near:    Bilateral Near:     UC Couse / Diagnostics / Procedures:     Radiology No results found.  Procedures Procedures (including critical care time) EKG  Pending results:  Labs Reviewed  POCT RAPID STREP A (OFFICE) - Abnormal; Notable for the following components:      Result Value   Rapid Strep A Screen Positive (*)    All other components within normal limits     Medications Ordered in UC: Medications - No data to display  UC Diagnoses / Final Clinical Impressions(s)   I have reviewed the triage vital signs and the nursing notes.  Pertinent labs & imaging results that were available during my care of the patient were reviewed by me and considered in my medical decision making (see chart for details).    Final diagnoses:  Acute streptococcal pharyngitis  Antibiotic-induced yeast infection   Rapid strep test today was positive.  Patient provided with a 10-day course of amoxicillin .  Patient provided with a prescription for Diflucan for inevitable antibiotic-induced yeast infection while taking 10 days of amoxicillin .  Patient was advised to contact the parents of her grandchildren to discuss testing and possible treatment for strep as she spends a lot of time around them daily.  Conservative care recommended.  Return cautions advised.  Please see discharge instructions below for details of plan of care as provided to patient. ED Prescriptions     Medication Sig Dispense Auth. Provider   amoxicillin  (AMOXIL ) 500 MG capsule Take 2 capsules (1,000 mg total) by mouth daily for 10 days. 20 capsule [name], PA-C   fluconazole (DIFLUCAN) 150 MG tablet Take 1 tablet on day 4 of antibiotics.  Take second tablet 3 days later. 2 tablet [name], PA-C      PDMP not reviewed this encounter.  Pending results:  Labs Reviewed  POCT RAPID STREP A (OFFICE) - Abnormal; Notable for the following components:      Result Value   Rapid Strep A Screen Positive (*)    All other components within normal limits      Discharge  Instructions      Your strep test today is positive.  I recommend that you begin antibiotics now for treatment.  I have sent a prescription to your pharmacy.  Please take them as prescribed.  You will begin to feel better in about 24 hours.  Please be sure that you you finish the entire 10-day course of  treatment to avoid worsening infection that may require longer treatment with stronger antibiotics.   After 24 hours, please discard your toothbrush as well as any other oral devices that you are currently using and replace them with new ones to avoid reinfection.  Also after 24 hours, you will no longer be contagious.    Please read below to learn more about the medications, dosages and frequencies that I recommend to help alleviate your symptoms and to get you feeling better soon:   Amoxicillin :  Please take two (2) capsules daily for 10 days.  This antibiotic can cause upset stomach, this will resolve once antibiotics are complete.  You are welcome to take a probiotic, eat yogurt, take Imodium while taking this medication.  Please avoid other systemic medications such as Maalox, Pepto-Bismol or milk of magnesia as they can interfere with the body's ability to absorb the antibiotics.  Diflucan (fluconazole): Taking antibiotics can often cause patients to develop a vaginal yeast infection.  For this reason, I have provided you with a prescription for Diflucan, and antifungal medication used to treat vaginal yeast infections.  Please take the first Diflucan tablet on day 3 or 4 of your antibiotic therapy, and take the second Diflucan tablet 3 days later.  You do not need to pick up this prescription or take this medication unless you develop symptoms of vaginal yeast infection including thick, white vaginal discharge and/or vaginal itching.  This prescription has been provided as a Research officer, political party and for your convenience.   Advil, Motrin (ibuprofen): This is a good anti-inflammatory medication which addresses aches, pains and inflammation of the upper airways that causes sinus and nasal congestion as well as in the lower airways which makes your cough feel tight and sometimes burn.  I recommend that you take between 400 to 600 mg every 6-8 hours as needed.  Please do not take more than 2400 mg of ibuprofen in a  24-hour period and please do not take high doses of ibuprofen for more than 3 days in a row as this can lead to stomach ulcers.   Chloraseptic Throat Spray: Spray 5 sprays into affected area every 2 hours, hold for 15 seconds and either swallow or spit it out.  This is a excellent numbing medication.  Because it is a spray, you can apply it where your throat hurts and avoid numbing your entire mouth as a sore throat lozenge will.     If symptoms have not meaningfully improved in the next 3 to 5 days, please return for repeat evaluation or follow-up with your regular provider.  If symptoms have worsened in the next 3 to 5 days, please go to the emergency room for further evaluation.    Thank you for visiting urgent care today.  We appreciate the opportunity to participate in your care.    Disposition Upon Discharge:  Condition: stable for discharge home  Patient presented with an acute illness with associated systemic symptoms and significant discomfort requiring urgent management. In my opinion, this is a condition that a prudent lay person (someone who possesses an average knowledge of health and medicine) may potentially expect  to result in complications if not addressed urgently such as respiratory distress, impairment of bodily function or dysfunction of bodily organs.   Routine symptom specific, illness specific and/or disease specific instructions were discussed with the patient and/or caregiver at length.   As such, the patient has been evaluated and assessed, work-up was performed and treatment was provided in alignment with urgent care protocols and evidence based medicine.  Patient/parent/caregiver has been advised that the patient may require follow up for further testing and treatment if the symptoms continue in spite of treatment, as clinically indicated and appropriate.  Patient/parent/caregiver has been advised to return to the [facility] or PCP if no better; to PCP or the Emergency  Department if new signs and symptoms develop, or if the current signs or symptoms continue to change or worsen for further workup, evaluation and treatment as clinically indicated and appropriate  The patient will follow up with their current PCP if and as advised. If the patient does not currently have a PCP we will assist them in obtaining one.   The patient may need specialty follow up if the symptoms continue, in spite of conservative treatment and management, for further workup, evaluation, consultation and treatment as clinically indicated and appropriate.  Patient/parent/caregiver verbalized understanding and agreement of plan as discussed.  All questions were addressed during visit.  Please see discharge instructions below for further details of plan.  This office note has been dictated using Teaching laboratory technician.  Unfortunately, this method of dictation can sometimes lead to typographical or grammatical errors.  I apologize for your inconvenience in advance if this occurs.  Please do not hesitate to reach out to me if clarification is needed.      [name], [place] 07/14/23 [phone]

## 2023-07-14 NOTE — ED Triage Notes (Signed)
 Pt presents to UC for c/o body aches, headache, sore throat x2 days. Took tylenol , goody powder

## 2023-07-31 ENCOUNTER — Other Ambulatory Visit: Payer: Self-pay | Admitting: Pharmacist

## 2023-07-31 ENCOUNTER — Encounter: Payer: Self-pay | Admitting: Pharmacist

## 2023-07-31 DIAGNOSIS — E785 Hyperlipidemia, unspecified: Secondary | ICD-10-CM

## 2023-07-31 MED ORDER — ATORVASTATIN CALCIUM 40 MG PO TABS: 40.0000 mg | ORAL_TABLET | Freq: Every day | ORAL | 0 refills | Status: DC

## 2023-07-31 NOTE — Progress Notes (Signed)
   07/31/2023  Patient ID: Kristen Scott, female   DOB: [birth year], 68 y.o.   MRN: [id number]  Pharmacy Quality Measure Review  This patient is appearing on a report for being at risk of failing the adherence measure for diabetes medications this calendar year.   Medication: metformin  Last fill date: 03/05/2023 for 90 day supply per adherence report  Reviewed recent refill history - metformin  filled for 90 day supply 06/26/2023  Patient is also due to refill atorvastatin  but updated Rx is needed - sent to her pharmacy.  Called patient to remind her that she is due for complete physical with DR Copland on or after 09/07/2023 - had to LM on VM with office nubmer [phone]  Insurance report was not up to date. No action needed at this time.   Madelin Ray, PharmD Clinical Pharmacist [facility]  Population Health [phone]

## 2023-08-01 DIAGNOSIS — F411 Generalized anxiety disorder: Secondary | ICD-10-CM | POA: Diagnosis not present

## 2023-08-07 ENCOUNTER — Encounter: Payer: Self-pay | Admitting: Family Medicine

## 2023-08-07 DIAGNOSIS — E669 Obesity, unspecified: Secondary | ICD-10-CM

## 2023-08-07 MED ORDER — TIRZEPATIDE 7.5 MG/0.5ML ~~LOC~~ SOAJ: 7.5000 mg | SUBCUTANEOUS | 1 refills | Status: DC

## 2023-08-08 MED ORDER — TIRZEPATIDE-WEIGHT MANAGEMENT 7.5 MG/0.5ML ~~LOC~~ SOLN: 7.5000 mg | SUBCUTANEOUS | 1 refills | Status: DC

## 2023-08-08 NOTE — Telephone Encounter (Signed)
 Copied from CRM [id number]. Topic: Clinical - Prescription Issue >> Aug 08, 2023 11:56 AM Chiquita SQUIBB wrote: Reason for CRM: The pharmacy is calling in regarding the tirzepatide  (MOUNJARO ) 7.5 MG/0.5ML Pen [[id number]]. The speciality pharmacy does not fill Mounjardo but they do fill Zepbound . They are checking to see if this prescription was supposed to be for Zepbound . A good call back number is [phone]

## 2023-08-08 NOTE — Addendum Note (Signed)
 Addended by: WATT RAISIN C on: 08/08/2023 12:45 PM   Modules accepted: Orders

## 2023-08-20 DIAGNOSIS — K219 Gastro-esophageal reflux disease without esophagitis: Secondary | ICD-10-CM | POA: Diagnosis not present

## 2023-08-20 DIAGNOSIS — Z860100 Personal history of colon polyps, unspecified: Secondary | ICD-10-CM | POA: Diagnosis not present

## 2023-08-24 DIAGNOSIS — D1801 Hemangioma of skin and subcutaneous tissue: Secondary | ICD-10-CM | POA: Diagnosis not present

## 2023-08-24 DIAGNOSIS — D485 Neoplasm of uncertain behavior of skin: Secondary | ICD-10-CM | POA: Diagnosis not present

## 2023-09-09 NOTE — Progress Notes (Unsigned)
 [facility] [address], [address] [place], [place] [address] [phone]  Date:  09/12/2023   Name:  Kristen Scott   DOB:  1955/08/12   MRN:  [id number]  PCP:  Watt Harlene BROCKS, MD    Chief Complaint: No chief complaint on file.   History of Present Illness:  Kristen Scott is a 68 y.o. very pleasant female patient who presents with the following:  Patient seen today for physical exam.  Most recent visit with myself was a virtual visit in May-at that time she was using some semaglutide  from a compounding provider.  She did quite well with weight loss, she had been up to about 220 pounds and at her last visit was about 150 We switched her over to tirzepatide  through [facility] at her last visit  - history of hypertension, prediabetes, hyperlipidemia, chronic arthritis pain, obesity   Recommend flu, COVID boosters this fall Mammogram is up-to-date Colonoscopy is up-to-date DEXA scan can be updated in November-osteopenia at last screening She has completed Shingrix and RSV Most recent lab work about a year ago, can update today  Lipitor 40 Vitamin D  Cymbalta -?  If taking Metformin  500 mg daily Toprol -XL 75 Prilosec Tirzepatide  Patient Active Problem List   Diagnosis Date Noted   Osteopenia 12/08/2019   Carpal tunnel syndrome 09/30/2015   Pre-diabetes 10/05/2014   GERD (gastroesophageal reflux disease) 03/05/2014   Vitamin D  deficiency 08/20/2011   Hyperlipidemia 08/20/2011   HTN (hypertension) 08/20/2011   Obesity, Class II, BMI 35-39.9 08/20/2011   Chronic pain disorder 08/20/2011   Depression 08/20/2011   DJD (degenerative joint disease) of knee 08/20/2011   Arthritis, low back 03/30/2011   Arthritis of neck (HCC) 03/30/2011    Past Medical History:  Diagnosis Date   Allergy 2007   A rash from anti inflammatory medicine   Arthritis    Depression    DJD (degenerative joint disease)    left  knee   GERD (gastroesophageal reflux disease)    Hearing loss, sensorineural    HTN (hypertension)    Hyperlipidemia    Migraine    Osteopenia     Past Surgical History:  Procedure Laterality Date   BREAST BIOPSY Right 05/25/2016   JOINT REPLACEMENT  2008   left knee    Social History   Tobacco Use   Smoking status: Never   Smokeless tobacco: Never  Substance Use Topics   Alcohol use: Never   Drug use: Never    Family History  Problem Relation Age of Onset   Dementia Mother    Early death Mother    Hypertension Mother    Kidney disease Mother    Diabetes Father    Arthritis Father    Early death Father    Vision loss Father    Diabetes Brother    Stroke Brother    Mental illness Daughter        anxiety, OCD   Anxiety disorder Daughter    Diabetes Sister    Arthritis Sister    Obesity Sister    Diabetes Brother    Arthritis Brother    Heart attack Maternal Grandfather    Hearing loss Paternal Aunt    Obesity Paternal Aunt     Allergies  Allergen Reactions   Relafen [Nabumetone] Rash    Medication list has been reviewed and updated.  Current Outpatient Medications on File Prior to Visit  Medication Sig Dispense  Refill   acyclovir  ointment (ZOVIRAX ) 5 % Apply 1 Application topically every 4 (four) hours as needed. 30 g 0   atorvastatin  (LIPITOR) 40 MG tablet Take 1 tablet (40 mg total) by mouth daily. 90 tablet 0   cholecalciferol (VITAMIN D3) 25 MCG (1000 UNIT) tablet Take 1,000 Units by mouth daily.     DULoxetine  (CYMBALTA ) 30 MG capsule Take 90 mg by mouth daily.     fluconazole  (DIFLUCAN ) 150 MG tablet Take 1 tablet on day 4 of antibiotics.  Take second tablet 3 days later. 2 tablet 0   HYDROcodone-acetaminophen  (NORCO) 10-325 MG per tablet Take 1 tablet by mouth 2 (two) times daily.     metFORMIN  (GLUCOPHAGE ) 500 MG tablet Take 1 tablet (500 mg total) by mouth daily with breakfast. 30 tablet 0   metoprolol  succinate (TOPROL -XL) 25 MG 24 hr tablet  Take 3 tablets (75 mg total) by mouth daily. 270 tablet 3   omeprazole (PRILOSEC) 20 MG capsule Take 20 mg by mouth daily.     tirzepatide  (MOUNJARO ) 7.5 MG/0.5ML Pen Inject 7.5 mg into the skin once a week. 6 mL 1   tirzepatide  7.5 MG/0.5ML injection vial Inject 7.5 mg into the skin once a week. 6 mL 1   No current facility-administered medications on file prior to visit.    Review of Systems:  As per HPI- otherwise negative.   Physical Examination: There were no vitals filed for this visit. There were no vitals filed for this visit. There is no height or weight on file to calculate BMI. Ideal Body Weight:    GEN: no acute distress. HEENT: Atraumatic, Normocephalic.  Ears and Nose: No external deformity. CV: RRR, No M/G/R. No JVD. No thrill. No extra heart sounds. PULM: CTA B, no wheezes, crackles, rhonchi. No retractions. No resp. distress. No accessory muscle use. ABD: S, NT, ND, +BS. No rebound. No HSM. EXTR: No c/c/e PSYCH: Normally interactive. Conversant. ;  Assessment and Plan: *** Physical exam today.  Encouraged healthy diet and exercise routine Signed Harlene Schroeder, MD

## 2023-09-09 NOTE — Patient Instructions (Incomplete)
 It was great to see you again today, I will be in touch with your labs soon as possible Recommend updating your COVID and flu shots this fall  Let's decrease your metoprolol  to 1/2 tablet (12.5 mg) daily  We can try tapering your zepbound  when you are ready

## 2023-09-12 ENCOUNTER — Encounter: Payer: Self-pay | Admitting: Family Medicine

## 2023-09-12 ENCOUNTER — Ambulatory Visit (INDEPENDENT_AMBULATORY_CARE_PROVIDER_SITE_OTHER): Admitting: Family Medicine

## 2023-09-12 VITALS — BP 102/64 | HR 72 | Temp 97.7°F | Ht 62.0 in | Wt 138.4 lb

## 2023-09-12 DIAGNOSIS — E559 Vitamin D deficiency, unspecified: Secondary | ICD-10-CM

## 2023-09-12 DIAGNOSIS — Z1329 Encounter for screening for other suspected endocrine disorder: Secondary | ICD-10-CM | POA: Diagnosis not present

## 2023-09-12 DIAGNOSIS — I10 Essential (primary) hypertension: Secondary | ICD-10-CM | POA: Diagnosis not present

## 2023-09-12 DIAGNOSIS — E785 Hyperlipidemia, unspecified: Secondary | ICD-10-CM

## 2023-09-12 DIAGNOSIS — E2839 Other primary ovarian failure: Secondary | ICD-10-CM

## 2023-09-12 DIAGNOSIS — Z0000 Encounter for general adult medical examination without abnormal findings: Secondary | ICD-10-CM

## 2023-09-12 DIAGNOSIS — Z130 Encounter for screening for diseases of the blood and blood-forming organs and certain disorders involving the immune mechanism: Secondary | ICD-10-CM | POA: Diagnosis not present

## 2023-09-12 DIAGNOSIS — R7303 Prediabetes: Secondary | ICD-10-CM

## 2023-09-12 MED ORDER — METOPROLOL SUCCINATE ER 25 MG PO TB24: ORAL_TABLET | ORAL | 3 refills | Status: AC

## 2023-09-12 MED ORDER — ATORVASTATIN CALCIUM 40 MG PO TABS: 40.0000 mg | ORAL_TABLET | Freq: Every day | ORAL | 3 refills | Status: AC

## 2023-09-12 MED ORDER — METOPROLOL SUCCINATE ER 25 MG PO TB24: 75.0000 mg | ORAL_TABLET | Freq: Every day | ORAL | 3 refills | Status: DC

## 2023-09-12 MED ORDER — METFORMIN HCL 500 MG PO TABS: 500.0000 mg | ORAL_TABLET | Freq: Every day | ORAL | 3 refills | Status: AC

## 2023-09-13 ENCOUNTER — Encounter: Payer: Self-pay | Admitting: Family Medicine

## 2023-09-13 DIAGNOSIS — R7401 Elevation of levels of liver transaminase levels: Secondary | ICD-10-CM

## 2023-09-13 LAB — COMPREHENSIVE METABOLIC PANEL WITH GFR
ALT: 92 U/L — ABNORMAL HIGH (ref 0–35)
AST: 87 U/L — ABNORMAL HIGH (ref 0–37)
Albumin: 3.7 g/dL (ref 3.5–5.2)
Alkaline Phosphatase: 189 U/L — ABNORMAL HIGH (ref 39–117)
BUN: 21 mg/dL (ref 6–23)
CO2: 30 meq/L (ref 19–32)
Calcium: 8.9 mg/dL (ref 8.4–10.5)
Chloride: 102 meq/L (ref 96–112)
Creatinine, Ser: 0.87 mg/dL (ref 0.40–1.20)
GFR: 67.68 mL/min (ref >60.00)
Glucose, Bld: 77 mg/dL (ref 70–99)
Potassium: 4.4 meq/L (ref 3.5–5.1)
Sodium: 141 meq/L (ref 135–145)
Total Bilirubin: 0.2 mg/dL (ref 0.2–1.2)
Total Protein: 6 g/dL (ref 6.0–8.3)

## 2023-09-13 LAB — LIPID PANEL
Cholesterol: 130 mg/dL (ref 0–200)
HDL: 46.4 mg/dL (ref >39.00)
LDL Cholesterol: 68 mg/dL (ref 0–99)
NonHDL: 83.16
Total CHOL/HDL Ratio: 3
Triglycerides: 74 mg/dL (ref 0.0–149.0)
VLDL: 14.8 mg/dL (ref 0.0–40.0)

## 2023-09-13 LAB — CBC
HCT: 33.9 % — ABNORMAL LOW (ref 36.0–46.0)
Hemoglobin: 11.3 g/dL — ABNORMAL LOW (ref 12.0–15.0)
MCHC: 33.5 g/dL (ref 30.0–36.0)
MCV: 85.8 fl (ref 78.0–100.0)
Platelets: 163 K/uL (ref 150.0–400.0)
RBC: 3.95 Mil/uL (ref 3.87–5.11)
RDW: 13.8 % (ref 11.5–15.5)
WBC: 4.3 K/uL (ref 4.0–10.5)

## 2023-09-13 LAB — VITAMIN D 25 HYDROXY (VIT D DEFICIENCY, FRACTURES): VITD: 36.02 ng/mL (ref 30.00–100.00)

## 2023-09-13 LAB — TSH: TSH: 0.46 u[IU]/mL (ref 0.35–5.50)

## 2023-09-13 LAB — HEMOGLOBIN A1C: Hgb A1c MFr Bld: 5.8 % (ref 4.6–6.5)

## 2023-10-05 ENCOUNTER — Ambulatory Visit (HOSPITAL_BASED_OUTPATIENT_CLINIC_OR_DEPARTMENT_OTHER)
Admission: RE | Admit: 2023-10-05 | Discharge: 2023-10-05 | Disposition: A | Discharge status: Home / Self Care | Source: Ambulatory Visit | Attending: Family Medicine | Admitting: Family Medicine

## 2023-10-05 DIAGNOSIS — R7401 Elevation of levels of liver transaminase levels: Secondary | ICD-10-CM | POA: Diagnosis not present

## 2023-10-09 ENCOUNTER — Other Ambulatory Visit: Payer: Self-pay | Admitting: Family Medicine

## 2023-10-09 ENCOUNTER — Encounter: Payer: Self-pay | Admitting: Family Medicine

## 2023-10-09 DIAGNOSIS — R7401 Elevation of levels of liver transaminase levels: Secondary | ICD-10-CM

## 2023-10-23 IMAGING — MG MM DIGITAL SCREENING BILAT W/ TOMO AND CAD
8 series · 8 of 24 positions shown · non-contrast
Comparison: Previous exam(s).

CLINICAL DATA: Screening.

EXAM:
DIGITAL SCREENING BILATERAL MAMMOGRAM WITH TOMOSYNTHESIS AND CAD
TECHNIQUE: Bilateral screening digital craniocaudal and mediolateral oblique
mammograms were obtained. Bilateral screening digital breast
tomosynthesis was performed. The images were evaluated with
computer-aided detection.

[R MLO synth-2D]
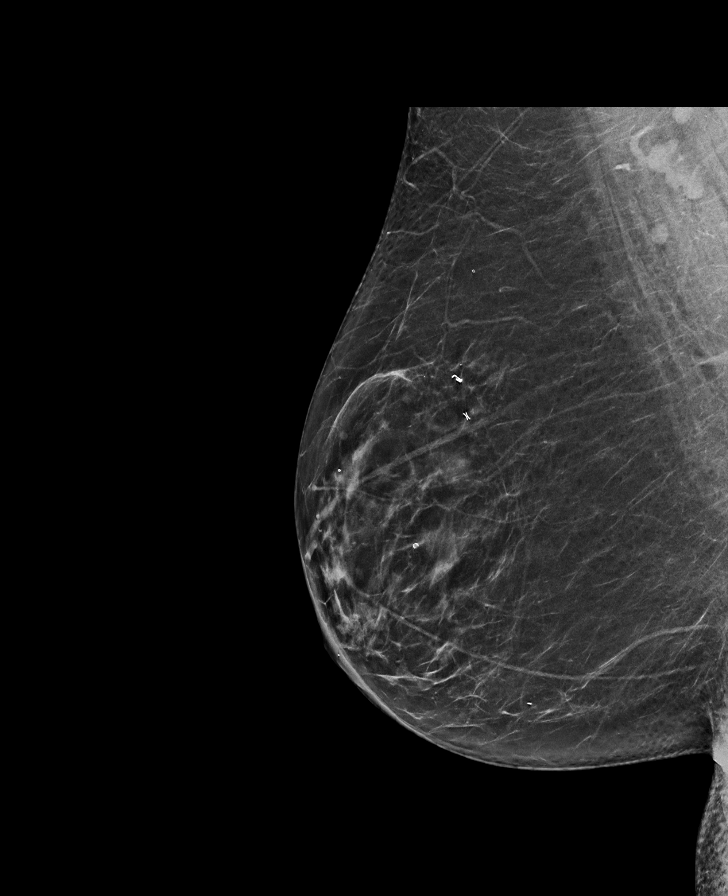

[L CC synth-2D]
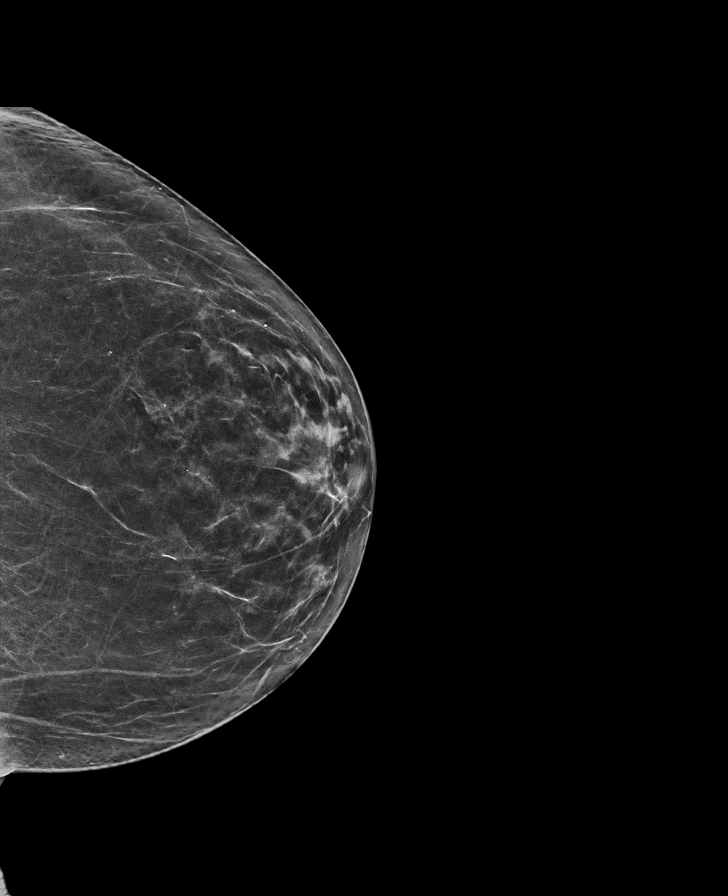

[L MLO synth-2D]
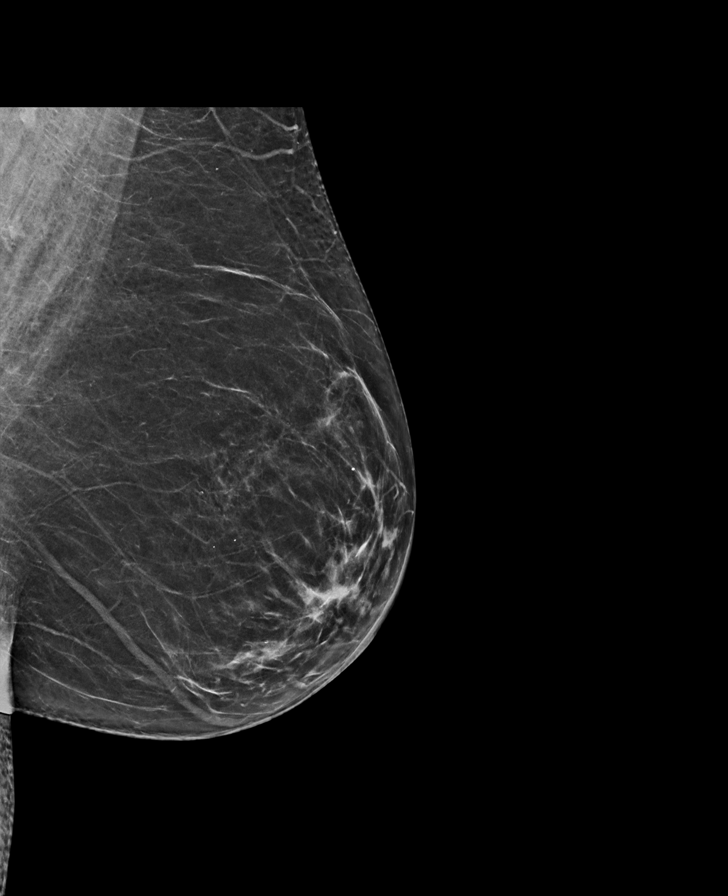

[R CC synth-2D]
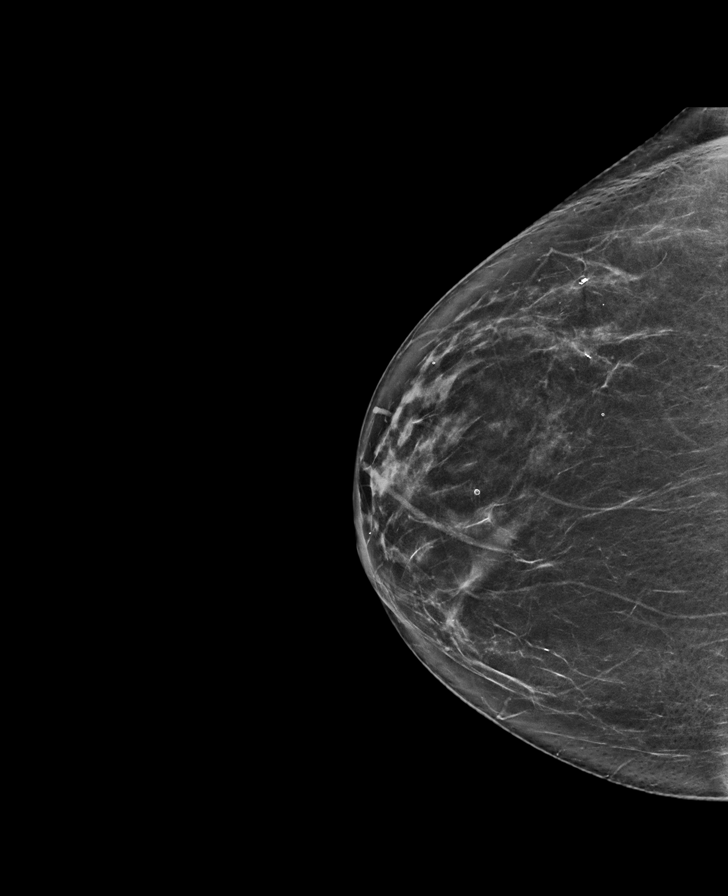

[L CC tomo · tomo slice 35/69.0]
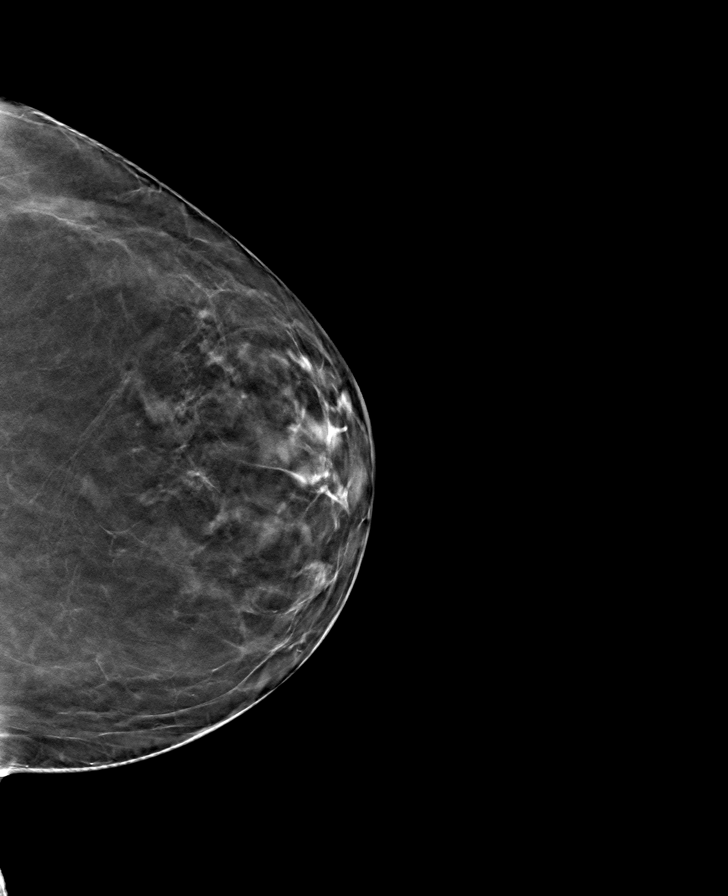

[R MLO tomo · tomo slice 38/75.0]
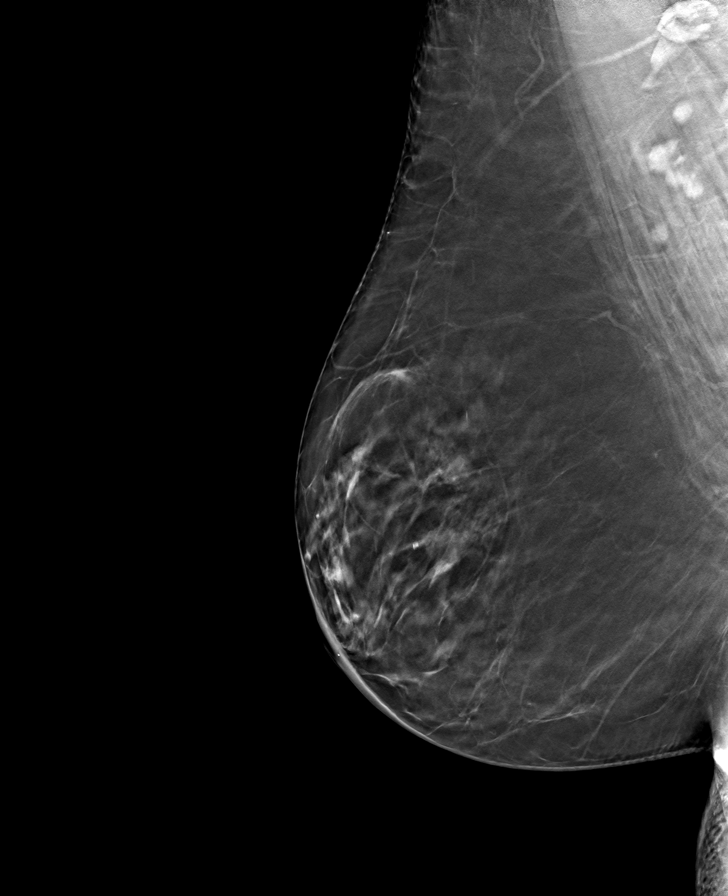

[R CC tomo · tomo slice 36/71.0]
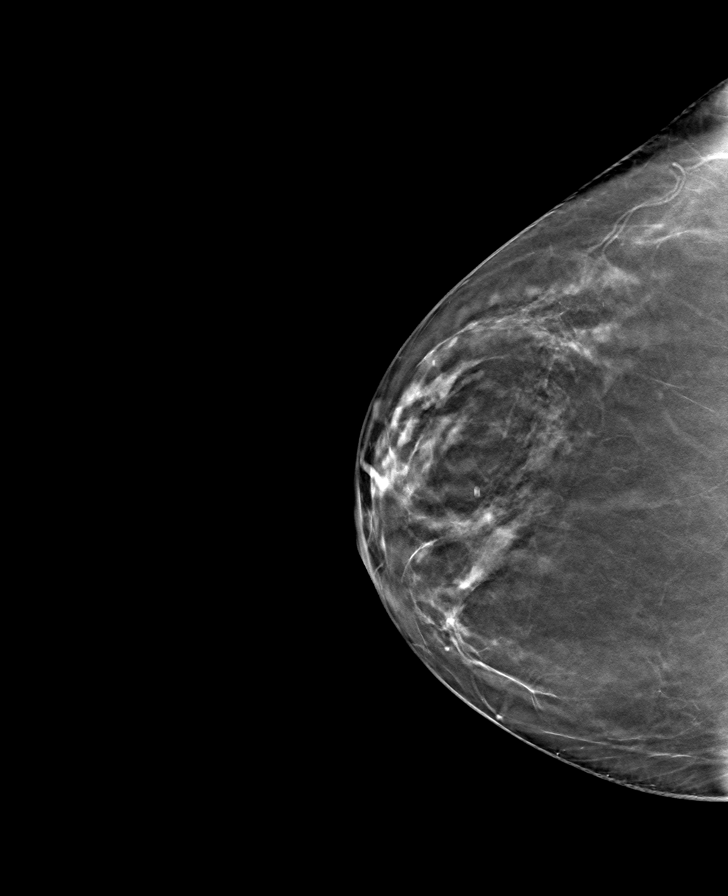

[L MLO tomo · tomo slice 37/74.0]
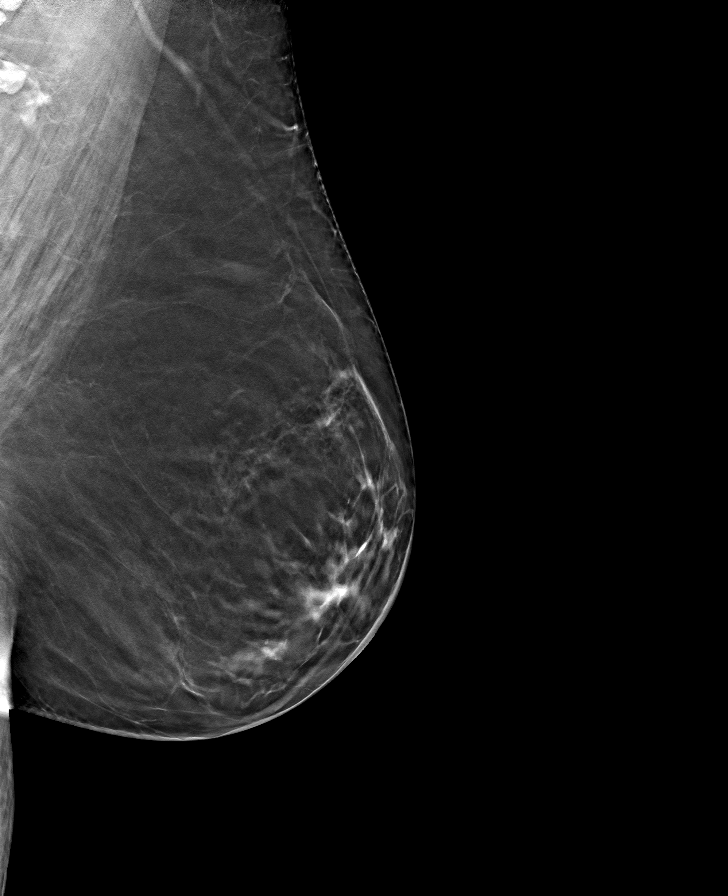

[8 of 24 positions shown; findings below may reference images not displayed]

ACR Breast Density Category b: There are scattered areas of
fibroglandular density.
FINDINGS: There are no findings suspicious for malignancy.
IMPRESSION: No mammographic evidence of malignancy. A result letter of this
screening mammogram will be mailed directly to the patient.

RECOMMENDATION:
Screening mammogram in one year. (Code:51-O-LD2)

BI-RADS CATEGORY  1: Negative.

## 2023-10-29 DIAGNOSIS — Z79899 Other long term (current) drug therapy: Secondary | ICD-10-CM | POA: Diagnosis not present

## 2023-10-29 DIAGNOSIS — M51362 Other intervertebral disc degeneration, lumbar region with discogenic back pain and lower extremity pain: Secondary | ICD-10-CM | POA: Diagnosis not present

## 2023-11-06 ENCOUNTER — Encounter: Payer: Self-pay | Admitting: Family Medicine

## 2023-11-07 ENCOUNTER — Other Ambulatory Visit (HOSPITAL_COMMUNITY): Payer: Self-pay

## 2023-11-07 ENCOUNTER — Encounter: Payer: Self-pay | Admitting: Family Medicine

## 2023-11-07 MED ORDER — TIRZEPATIDE-WEIGHT MANAGEMENT 10 MG/0.5ML ~~LOC~~ SOLN: 10.0000 mg | SUBCUTANEOUS | 2 refills | Status: AC

## 2023-11-07 MED ORDER — TIRZEPATIDE-WEIGHT MANAGEMENT 10 MG/0.5ML ~~LOC~~ SOLN
10.0000 mg | SUBCUTANEOUS | 2 refills | Status: DC
Start: 2023-11-07 — End: 2023-11-07

## 2023-11-15 DIAGNOSIS — H6121 Impacted cerumen, right ear: Secondary | ICD-10-CM | POA: Diagnosis not present

## 2023-12-19 ENCOUNTER — Ambulatory Visit
Admission: EM | Admit: 2023-12-19 | Discharge: 2023-12-19 | Disposition: A | Discharge status: Home / Self Care | Attending: Family Medicine | Admitting: Family Medicine

## 2023-12-19 ENCOUNTER — Other Ambulatory Visit: Payer: Self-pay

## 2023-12-19 DIAGNOSIS — R21 Rash and other nonspecific skin eruption: Secondary | ICD-10-CM | POA: Diagnosis not present

## 2023-12-19 DIAGNOSIS — J029 Acute pharyngitis, unspecified: Secondary | ICD-10-CM | POA: Diagnosis not present

## 2023-12-19 LAB — POCT RAPID STREP A (OFFICE): Rapid Strep A Screen: NEGATIVE

## 2023-12-19 MED ORDER — TRIAMCINOLONE ACETONIDE 0.1 % EX CREA: 1.0000 | TOPICAL_CREAM | Freq: Two times a day (BID) | CUTANEOUS | 0 refills | Status: DC

## 2023-12-19 NOTE — Discharge Instructions (Signed)
 The counter antihistamine such as Claritin, Allegra or Zyrtec as directed on the package once daily and topical hydrocortisone cream to rash on neck as needed until cleared.  Follow-up for new, worsening symptoms or concerns specifically for fever, difficulty swallowing, swelling of neck

## 2023-12-19 NOTE — ED Triage Notes (Addendum)
 Pt states she wants to make sure she is treating a rash she has correctly. 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 PT denies any fevers. Pt has been doing rinses and Tylenol  for the sore throat. Pt has rash around her neck and she's been putting steroid cream on it.

## 2023-12-19 NOTE — ED Provider Notes (Signed)
 [facility]    CSN: [id number] Arrival date & time: 12/19/23  0932      History   Chief Complaint Chief Complaint  Patient presents with   Sore Throat   Rash    HPI Kristen Scott is a 68 y.o. female.   The history is provided by the patient.  Sore Throat  Rash Sore throat onset 4 days ago associated with mild rhinorrhea, nasal congestion and a slight cough.  Also has a rash on the left side of her neck for 3 to 4 days described as bumps that are very itchy.  She treated it with a MRSA cream for 1 day then switch to hydrocortisone cream.  She is concerned it might be poison ivy.  Denies fever, chills, sweats, new body aches, fatigue, earache, headache, nausea, vomiting, diarrhea.  She works at [employer] with 59-year-old children for 4 days a week, also has grandchildren who have been sick one of them recently had strep.  Denies change in hygiene products, new medications or over-the-counter supplements.  Denies history of similar rash in the past.  Past Medical History:  Diagnosis Date   Allergy 2007   A rash from anti inflammatory medicine   Arthritis    Depression    DJD (degenerative joint disease)    left knee   GERD (gastroesophageal reflux disease)    Hearing loss, sensorineural    HTN (hypertension)    Hyperlipidemia    Migraine    Osteopenia     Patient Active Problem List   Diagnosis Date Noted   Osteopenia 12/08/2019   Carpal tunnel syndrome 09/30/2015   Pre-diabetes 10/05/2014   GERD (gastroesophageal reflux disease) 03/05/2014   Vitamin D  deficiency 08/20/2011   Hyperlipidemia 08/20/2011   HTN (hypertension) 08/20/2011   Obesity, Class II, BMI 35-39.9 08/20/2011   Chronic pain disorder 08/20/2011   Depression 08/20/2011   DJD (degenerative joint disease) of knee 08/20/2011   Arthritis, low back 03/30/2011   Arthritis of neck (HCC) 03/30/2011    Past Surgical History:  Procedure Laterality Date   BREAST BIOPSY Right 05/25/2016   JOINT  REPLACEMENT  2008   left knee    OB History   No obstetric history on file.      Home Medications    Prior to Admission medications   Medication Sig Start Date End Date Taking? Authorizing Provider  triamcinolone  cream (KENALOG) 0.1 % Apply 1 Application topically 2 (two) times daily. 12/19/23  Yes Mellanie Bejarano, PA  acyclovir  ointment (ZOVIRAX ) 5 % Apply 1 Application topically every 4 (four) hours as needed. 07/09/23   Vivienne Delon HERO, PA-C  atorvastatin  (LIPITOR) 40 MG tablet Take 1 tablet (40 mg total) by mouth daily. 09/12/23   Copland, Jessica C, MD  cholecalciferol (VITAMIN D3) 25 MCG (1000 UNIT) tablet Take 1,000 Units by mouth daily.    [provider]  DULoxetine  (CYMBALTA ) 30 MG capsule Take 90 mg by mouth daily. 08/17/11   Elizabeth Heron NOVAK, MD  HYDROcodone-acetaminophen  [facility]) 10-325 MG per tablet Take 1 tablet by mouth 2 (two) times daily.    [provider]  metFORMIN  (GLUCOPHAGE ) 500 MG tablet Take 1 tablet (500 mg total) by mouth daily with breakfast. 09/12/23   Copland, Harlene BROCKS, MD  metoprolol  succinate (TOPROL -XL) 25 MG 24 hr tablet Take one 1/2 tablet/ 12.5 mg by mouth daily 09/12/23   Copland, Jessica C, MD  omeprazole (PRILOSEC) 20 MG capsule Take 20 mg by mouth daily.  [provider]  tirzepatide  10 MG/0.5ML injection vial Inject 10 mg into the skin once a week. 11/07/23   Copland, Harlene BROCKS, MD    Family History Family History  Problem Relation Age of Onset   Dementia Mother    Early death Mother    Hypertension Mother    Kidney disease Mother    Diabetes Father    Arthritis Father    Early death Father    Vision loss Father    Diabetes Brother    Stroke Brother    Mental illness Daughter        anxiety, OCD   Anxiety disorder Daughter    Diabetes Sister    Arthritis Sister    Obesity Sister    Diabetes Brother    Arthritis Brother    Heart attack Maternal Grandfather    Hearing loss Paternal Aunt    Obesity  Paternal Aunt     Social History Social History   Tobacco Use   Smoking status: Never   Smokeless tobacco: Never  Substance Use Topics   Alcohol use: Never   Drug use: Never     Allergies   Relafen [nabumetone]   Review of Systems Review of Systems  Skin:  Positive for rash.     Physical Exam Triage Vital Signs ED Triage Vitals  Encounter Vitals Group     BP 12/19/23 0944 134/87     Girls Systolic BP Percentile --      Girls Diastolic BP Percentile --      Boys Systolic BP Percentile --      Boys Diastolic BP Percentile --      Pulse Rate 12/19/23 0944 (!) 101     Resp 12/19/23 0944 18     Temp 12/19/23 0944 (!) 97.1 F (36.2 C)     Temp Source 12/19/23 0944 Oral     SpO2 12/19/23 0944 97 %     Weight --      Height --      Head Circumference --      Peak Flow --      Pain Score 12/19/23 0942 5     Pain Loc --      Pain Education --      Exclude from Growth Chart --    No data found.  Updated Vital Signs BP 134/87 (BP Location: Right Arm)   Pulse (!) 101   Temp (!) 97.1 F (36.2 C) (Oral)   Resp 18   LMP 03/29/2006   SpO2 97%   Visual Acuity Right Eye Distance:   Left Eye Distance:   Bilateral Distance:    Right Eye Near:   Left Eye Near:    Bilateral Near:     Physical Exam Vitals reviewed.  Constitutional:      Appearance: She is not ill-appearing.  HENT:     Head: Normocephalic and atraumatic.     Right Ear: Tympanic membrane and ear canal normal.     Left Ear: Tympanic membrane and ear canal normal.     Nose: No rhinorrhea.     Mouth/Throat:     Mouth: Mucous membranes are moist. No oral lesions.     Pharynx: Uvula midline. Posterior oropharyngeal erythema present. No pharyngeal swelling, oropharyngeal exudate or uvula swelling.     Comments: Normal swallowing, clear voice, tolerating secretions, no tonsillar enlargement Eyes:     Conjunctiva/sclera: Conjunctivae normal.  Cardiovascular:     Rate and Rhythm: Normal rate and  regular rhythm.  Pulmonary:     Effort: Pulmonary effort is normal.  Abdominal:     General: Bowel sounds are normal.  Musculoskeletal:     Cervical back: Neck supple.  Lymphadenopathy:     Cervical: No cervical adenopathy.  Neurological:     Mental Status: She is alert.      UC Treatments / Results  Labs (all labs ordered are listed, but only abnormal results are displayed) Labs Reviewed  POCT RAPID STREP A (OFFICE) - Normal    EKG   Radiology No results found.  Procedures Procedures (including critical care time)  Medications Ordered in UC Medications - No data to display  Initial Impression / Assessment and Plan / UC Course  I have reviewed the triage vital signs and the nursing notes.  Pertinent labs & imaging results that were available during my care of the patient were reviewed by me and considered in my medical decision making (see chart for details).     68 year old female with sore throat concerned about strep, she has mild pharyngeal erythema on exam but no fever.  She also has a rash on her neck which looks like possible insect bites.  Care strep is negative recommend OTC meds for symptomatic relief, recommend over-the-counter antihistamine and continue topical steroid for rash.  She is requesting a refill a prescription for triamcinolone  that she had before Rx sent to pharmacy. Final Clinical Impressions(s) / UC Diagnoses   Final diagnoses:  Rash and nonspecific skin eruption  Sore throat     Discharge Instructions      The counter antihistamine such as Claritin, Allegra or Zyrtec as directed on the package once daily and topical hydrocortisone cream to rash on neck as needed until cleared.  Follow-up for new, worsening symptoms or concerns specifically for fever, difficulty swallowing, swelling of neck     ED Prescriptions     Medication Sig Dispense Auth. Provider   triamcinolone  cream (KENALOG) 0.1 % Apply 1 Application topically 2 (two)  times daily. 15 each Bralyn Espino, PA      PDMP not reviewed this encounter.   Clell Trahan, [place] 12/19/23 1003

## 2024-01-18 ENCOUNTER — Ambulatory Visit: Payer: Medicare PPO

## 2024-01-18 VITALS — Ht 62.0 in | Wt 125.0 lb

## 2024-01-18 DIAGNOSIS — Z780 Asymptomatic menopausal state: Secondary | ICD-10-CM

## 2024-01-18 DIAGNOSIS — Z0000 Encounter for general adult medical examination without abnormal findings: Secondary | ICD-10-CM | POA: Diagnosis not present

## 2024-01-18 DIAGNOSIS — Z1231 Encounter for screening mammogram for malignant neoplasm of breast: Secondary | ICD-10-CM

## 2024-01-18 NOTE — Progress Notes (Signed)
 Please attest this visit in the absence of patient primary care provider.   Chief Complaint  Patient presents with   Medicare Wellness     Subjective:   Kristen Scott is a 68 y.o. female who presents for a Medicare Annual Wellness Visit.  Visit info / Clinical Intake: Medicare Wellness Visit Type:: Subsequent Annual Wellness Visit Persons participating in visit and providing information:: patient Medicare Wellness Visit Mode:: Telephone If telephone:: video declined Since this visit was completed virtually, some vitals may be partially provided or unavailable. Missing vitals are due to the limitations of the virtual format.: Unable to obtain vitals - no equipment If Telephone or Video please confirm:: I connected with patient using audio/video enable telemedicine. I verified patient identity with two identifiers, discussed telehealth limitations, and patient agreed to proceed. Patient Location:: home Provider Location:: office Interpreter Needed?: No Pre-visit prep was completed: yes AWV questionnaire completed by patient prior to visit?: yes Date:: 01/10/24 Living arrangements:: (!) (Patient-Rptd) lives alone Patient's Overall Health Status Rating: (Patient-Rptd) good Typical amount of pain: (!) (Patient-Rptd) a lot Does pain affect daily life?: (Patient-Rptd) no Are you currently prescribed opioids?: no  Dietary Habits and Nutritional Risks How many meals a day?: (Patient-Rptd) 3 Eats fruit and vegetables daily?: (Patient-Rptd) yes Most meals are obtained by: (Patient-Rptd) preparing own meals In the last 2 weeks, have you had any of the following?: none Diabetic:: no  Functional Status Activities of Daily Living (to include ambulation/medication): (Patient-Rptd) Independent Ambulation: Independent (has cane if needed) Medication Administration: (Patient-Rptd) Independent Home Management (perform basic housework or laundry): (Patient-Rptd) Independent Manage your own  finances?: (Patient-Rptd) yes Primary transportation is: (Patient-Rptd) driving Concerns about vision?: no *vision screening is required for WTM* (up to date with [facility]) Concerns about hearing?: (!) yes Uses hearing aids?: (!) yes  Fall Screening Falls in the past year?: (Patient-Rptd) 0 Number of falls in past year: 0 Was there an injury with Fall?: 0 Fall Risk Category Calculator: 0 Patient Fall Risk Level: Low Fall Risk  Fall Risk Patient at Risk for Falls Due to: Orthopedic patient Fall risk Follow up: Falls evaluation completed  Home and Transportation Safety: All rugs have non-skid backing?: (Patient-Rptd) yes All stairs or steps have railings?: (Patient-Rptd) yes Grab bars in the bathtub or shower?: (Patient-Rptd) yes Have non-skid surface in bathtub or shower?: (Patient-Rptd) yes Good home lighting?: (Patient-Rptd) yes Regular seat belt use?: (Patient-Rptd) yes Hospital stays in the last year:: (Patient-Rptd) no  Cognitive Assessment Difficulty concentrating, remembering, or making decisions? : yes (having intermittent difficulty with name recall) Will 6CIT or Mini Cog be Completed: yes What year is it?: 0 points What month is it?: 0 points Give patient an address phrase to remember (5 components): [address] [place]  About what time is it?: 0 points Count backwards from 20 to 1: 0 points Say the months of the year in reverse: 0 points Repeat the address phrase from earlier: 0 points 6 CIT Score: 0 points  Advance Directives (For Healthcare) Does Patient Have a Medical Advance Directive?: Yes Type of Advance Directive: Out of facility DNR (pink MOST or yellow form) Out of facility DNR (pink MOST or yellow form) in Chart? (Ambulatory ONLY): No - copy requested Would patient like information on creating a medical advance directive?: Yes (MAU/Ambulatory/Procedural Areas - Information given)  Reviewed/Updated  Reviewed/Updated: Reviewed All (Medical,  Surgical, Family, Medications, Allergies, Care Teams, Patient Goals)    Allergies (verified) Relafen [nabumetone]   Current Medications (verified) Outpatient  Encounter Medications as of 01/18/2024  Medication Sig   atorvastatin  (LIPITOR) 40 MG tablet Take 1 tablet (40 mg total) by mouth daily.   cholecalciferol (VITAMIN D3) 25 MCG (1000 UNIT) tablet Take 1,000 Units by mouth daily.   DULoxetine  (CYMBALTA ) 30 MG capsule Take 90 mg by mouth daily.   HYDROcodone-acetaminophen  (NORCO) 10-325 MG per tablet Take 1 tablet by mouth 2 (two) times daily.   metFORMIN  (GLUCOPHAGE ) 500 MG tablet Take 1 tablet (500 mg total) by mouth daily with breakfast.   metoprolol  succinate (TOPROL -XL) 25 MG 24 hr tablet Take one 1/2 tablet/ 12.5 mg by mouth daily   omeprazole (PRILOSEC) 20 MG capsule Take 20 mg by mouth daily.   tirzepatide  10 MG/0.5ML injection vial Inject 10 mg into the skin once a week.   [DISCONTINUED] acyclovir  ointment (ZOVIRAX ) 5 % Apply 1 Application topically every 4 (four) hours as needed.   [DISCONTINUED] triamcinolone  cream (KENALOG ) 0.1 % Apply 1 Application topically 2 (two) times daily.   No facility-administered encounter medications on file as of 01/18/2024.    History: Past Medical History:  Diagnosis Date   Allergy 2007   A rash from anti inflammatory medicine   Anemia    Arthritis    Depression    DJD (degenerative joint disease)    left knee   GERD (gastroesophageal reflux disease)    Hearing loss, sensorineural    HTN (hypertension)    Hyperlipidemia    Migraine    Osteopenia    Past Surgical History:  Procedure Laterality Date   BREAST BIOPSY Right 05/25/2016   JOINT REPLACEMENT  2008   left knee   Family History  Problem Relation Age of Onset   Dementia Mother    Early death Mother    Hypertension Mother    Kidney disease Mother    Diabetes Father    Arthritis Father    Early death Father    Vision loss Father    Diabetes Brother    Stroke  Brother    Mental illness Daughter        anxiety, OCD   Anxiety disorder Daughter    Diabetes Sister    Arthritis Sister    Obesity Sister    Diabetes Brother    Arthritis Brother    Heart attack Maternal Grandfather    Hearing loss Paternal Aunt    Obesity Paternal Aunt    Social History   Occupational History   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, newspaper   Occupation: retired Magazine Features Editor: [employer]   Tobacco Use   Smoking status: Never   Smokeless tobacco: Never  Substance and Sexual Activity   Alcohol use: Never   Drug use: Never   Sexual activity: Not Currently    Birth control/protection: None   Tobacco Counseling Counseling given: Not Answered  SDOH Screenings   Food Insecurity: Food Insecurity Present (01/13/2024)  Housing: Low Risk (01/13/2024)  Transportation Needs: No Transportation Needs (01/13/2024)  Utilities: Not At Risk (01/18/2024)  Alcohol Screen: Low Risk (01/12/2023)  Depression (PHQ2-9): Medium Risk (01/18/2024)  Financial Resource Strain: Low Risk (01/13/2024)  Physical Activity: Insufficiently Active (01/13/2024)  Social Connections: Moderately Integrated (01/13/2024)  Stress: No Stress Concern Present (01/13/2024)  Tobacco Use: Low Risk (01/18/2024)  Health Literacy: Adequate Health Literacy (01/17/2023)   See flowsheets for full screening details  Depression Screen PHQ 2 & 9 Depression Scale- Over the past 2 weeks, how often have you been bothered by any of the following  problems? Little interest or pleasure in doing things: 0 Feeling down, depressed, or hopeless (PHQ Adolescent also includes...irritable): 0 PHQ-2 Total Score: 0 Trouble falling or staying asleep, or sleeping too much: 3 (has difficulty staying asleep. Goes to sleep early and wakes up early but averages 9 hr a night) Feeling tired or having little energy: 0 Poor appetite or overeating (PHQ Adolescent also includes...weight loss): 0 Feeling bad about yourself - or  that you are a failure or have let yourself or your family down: 0 Trouble concentrating on things, such as reading the newspaper or watching television (PHQ Adolescent also includes...like school work): 3 (thinks due to getting interrupted then doesn't remember what she was doing) Moving or speaking so slowly that other people could have noticed. Or the opposite - being so fidgety or restless that you have been moving around a lot more than usual: 0 Thoughts that you would be better off dead, or of hurting yourself in some way: 0 PHQ-9 Total Score: 6 If you checked off any problems, how difficult have these problems made it for you to do your work, take care of things at home, or get along with other people?: Not difficult at all     Goals Addressed             This Visit's Progress    To increase exercise from 3 days a week and keep weight around 130               Objective:    Today's Vitals   01/18/24 0821  Weight: 125 lb (56.7 kg)  Height: 5' 2 (1.575 m)   Body mass index is 22.86 kg/m.  Hearing/Vision screen No results found. Immunizations and Health Maintenance Health Maintenance  Topic Date Due   Pneumococcal Vaccine: 50+ Years (2 of 2 - PCV) 11/10/2022   COVID-19 Vaccine (7 - Pfizer risk 2025-26 season) 04/30/2024   Medicare Annual Wellness (AWV)  01/17/2025   Mammogram  02/22/2025   Colonoscopy  02/11/2026   DTaP/Tdap/Td (3 - Td or Tdap) 10/18/2027   Influenza Vaccine  Completed   Bone Density Scan  Completed   Hepatitis C Screening  Completed   Zoster Vaccines- Shingrix  Completed   Meningococcal B Vaccine  Aged Out        Assessment/Plan:  This is a routine wellness examination for Kaleb.  Patient Care Team: Copland, Harlene BROCKS, MD as PCP - General (Family Medicine) Bonner Ade, MD as Consulting Physician (Physical Medicine and Rehabilitation)  I have personally reviewed and noted the following in the patients chart:   Medical and social  history Use of alcohol, tobacco or illicit drugs  Current medications and supplements including opioid prescriptions. Functional ability and status Nutritional status Physical activity Advanced directives List of other physicians Hospitalizations, surgeries, and ER visits in previous 12 months Vitals Screenings to include cognitive, depression, and falls Referrals and appointments  No orders of the defined types were placed in this encounter.  In addition, I have reviewed and discussed with patient certain preventive protocols, quality metrics, and best practice recommendations. A written personalized care plan for preventive services as well as general preventive health recommendations were provided to patient.   Lolita Libra, CMA   01/18/2024   Return in 1 year (on 01/17/2025).  After Visit Summary: (MyChart) Due to this being a telephonic visit, the after visit summary with patients personalized plan was offered to patient via MyChart   Nurse Notes: Appointment(s) made: (AWV, PCP) HM  Addressed: Vaccines Due: Pneumonia and will discuss with PCP. Mammogram ordered DEXA ordered

## 2024-01-18 NOTE — Patient Instructions (Addendum)
 Kristen Scott,  Thank you for taking the time for your Medicare Wellness Visit. I appreciate your continued commitment to your health goals. Please review the care plan we discussed, and feel free to reach out if I can assist you further.  Please note that Annual Wellness Visits do not include a physical exam. Some assessments may be limited, especially if the visit was conducted virtually. If needed, we may recommend an in-person follow-up with your provider.  Goal:  To increase exercise to more than 3 days a week and keep weight around 130  Ongoing Care Seeing your primary care provider every 3 to 6 months helps us  monitor your health and provide consistent, personalized care.   Dr Watt:  03/19/24 2:40pm Medicare AWV:  01/23/25 3pm, in person  Referrals If a referral was made during today's visit and you haven't received any updates within two weeks, please contact the referred provider directly to check on the status.  Mammogram ([facility]): [phone] Bone Density ([facility]):  [phone]  Recommended Screenings:  Pneumonia booster, please discuss with Dr Watt at your next office visit.  Health Maintenance  Topic Date Due   Pneumococcal Vaccine for age over 42 (2 of 2 - PCV) 11/10/2022   Medicare Annual Wellness Visit  01/17/2024   COVID-19 Vaccine (7 - Pfizer risk 2025-26 season) 04/30/2024   Breast Cancer Screening  02/22/2025   Colon Cancer Screening  02/11/2026   DTaP/Tdap/Td vaccine (3 - Td or Tdap) 10/18/2027   Flu Shot  Completed   Osteoporosis screening with Bone Density Scan  Completed   Hepatitis C Screening  Completed   Zoster (Shingles) Vaccine  Completed   Meningitis B Vaccine  Aged Out       01/13/2024   10:53 AM  Advanced Directives  Does Patient Have a Medical Advance Directive? No  Would patient like information on creating a medical advance directive? No - Patient declined   Please bring a copy of your health care power of  attorney and living will to the office to be added to your chart at your convenience. You can mail a copy to [facility] [address] [place], [place] [address] or email to [email or web address]   Vision: Annual vision screenings are recommended for early detection of glaucoma, cataracts, and diabetic retinopathy. These exams can also reveal signs of chronic conditions such as diabetes and high blood pressure.  Dental: Annual dental screenings help detect early signs of oral cancer, gum disease, and other conditions linked to overall health, including heart disease and diabetes.  Please see the attached documents for additional preventive care recommendations.

## 2024-01-22 ENCOUNTER — Encounter: Payer: Self-pay | Admitting: Family Medicine

## 2024-02-28 ENCOUNTER — Ambulatory Visit (HOSPITAL_BASED_OUTPATIENT_CLINIC_OR_DEPARTMENT_OTHER)
Admission: RE | Admit: 2024-02-28 | Discharge: 2024-02-28 | Disposition: A | Discharge status: Home / Self Care | Source: Ambulatory Visit | Attending: Family Medicine | Admitting: Family Medicine

## 2024-02-28 DIAGNOSIS — Z780 Asymptomatic menopausal state: Secondary | ICD-10-CM | POA: Insufficient documentation

## 2024-02-29 ENCOUNTER — Ambulatory Visit
Admission: RE | Admit: 2024-02-29 | Discharge: 2024-02-29 | Disposition: A | Source: Ambulatory Visit | Attending: Family Medicine | Admitting: Family Medicine

## 2024-02-29 DIAGNOSIS — Z1231 Encounter for screening mammogram for malignant neoplasm of breast: Secondary | ICD-10-CM

## 2024-03-04 ENCOUNTER — Encounter: Payer: Self-pay | Admitting: Family Medicine

## 2024-03-04 DIAGNOSIS — M8580 Other specified disorders of bone density and structure, unspecified site: Secondary | ICD-10-CM

## 2024-03-04 MED ORDER — ALENDRONATE SODIUM 70 MG PO TABS: 70.0000 mg | ORAL_TABLET | ORAL | 11 refills | Status: AC

## 2024-03-10 ENCOUNTER — Encounter: Payer: Self-pay | Admitting: Family Medicine

## 2024-03-10 DIAGNOSIS — R928 Other abnormal and inconclusive findings on diagnostic imaging of breast: Secondary | ICD-10-CM

## 2024-03-10 NOTE — Addendum Note (Signed)
 Addended by: WATT RAISIN C on: 03/10/2024 03:44 PM   Modules accepted: Orders

## 2024-03-11 ENCOUNTER — Other Ambulatory Visit: Payer: Self-pay | Admitting: Family Medicine

## 2024-03-11 DIAGNOSIS — R928 Other abnormal and inconclusive findings on diagnostic imaging of breast: Secondary | ICD-10-CM

## 2024-03-18 ENCOUNTER — Other Ambulatory Visit

## 2024-03-18 ENCOUNTER — Encounter

## 2024-03-19 ENCOUNTER — Ambulatory Visit: Admitting: Family Medicine

## 2025-01-23 ENCOUNTER — Ambulatory Visit
